# Patient Record
Sex: Female | Born: 1945 | Race: White | Hispanic: No | Marital: Married | Smoking: Former smoker
Health system: Southern US, Community
[De-identification: ages and names within clinical notes are randomized; demographics above are authoritative.]

## PROBLEM LIST (undated history)

## (undated) DIAGNOSIS — M1990 Unspecified osteoarthritis, unspecified site: Secondary | ICD-10-CM

## (undated) DIAGNOSIS — F0390 Unspecified dementia without behavioral disturbance: Secondary | ICD-10-CM

## (undated) DIAGNOSIS — F32A Depression, unspecified: Secondary | ICD-10-CM

## (undated) DIAGNOSIS — F329 Major depressive disorder, single episode, unspecified: Secondary | ICD-10-CM

## (undated) DIAGNOSIS — I10 Essential (primary) hypertension: Secondary | ICD-10-CM

## (undated) HISTORY — PX: ABDOMINAL HYSTERECTOMY: SHX81

---

## 2003-11-25 ENCOUNTER — Ambulatory Visit: Payer: Self-pay | Admitting: Internal Medicine

## 2003-12-18 ENCOUNTER — Ambulatory Visit: Payer: Self-pay | Admitting: Internal Medicine

## 2004-05-11 ENCOUNTER — Encounter: Admission: RE | Admit: 2004-05-11 | Discharge: 2004-05-11 | Payer: Self-pay

## 2004-09-10 ENCOUNTER — Encounter: Admission: RE | Admit: 2004-09-10 | Discharge: 2004-09-10 | Payer: Self-pay

## 2004-09-30 ENCOUNTER — Encounter: Admission: RE | Admit: 2004-09-30 | Discharge: 2004-09-30 | Payer: Self-pay

## 2004-10-25 ENCOUNTER — Encounter: Admission: RE | Admit: 2004-10-25 | Discharge: 2004-10-25 | Payer: Self-pay

## 2005-03-05 ENCOUNTER — Emergency Department: Payer: Self-pay | Admitting: Internal Medicine

## 2005-05-25 ENCOUNTER — Encounter: Admission: RE | Admit: 2005-05-25 | Discharge: 2005-05-25 | Payer: Self-pay

## 2005-11-23 ENCOUNTER — Ambulatory Visit: Payer: Self-pay | Admitting: Internal Medicine

## 2005-12-21 ENCOUNTER — Encounter: Admission: RE | Admit: 2005-12-21 | Discharge: 2005-12-21 | Payer: Self-pay

## 2007-01-22 ENCOUNTER — Emergency Department: Payer: Self-pay | Admitting: Unknown Physician Specialty

## 2007-01-22 ENCOUNTER — Other Ambulatory Visit: Payer: Self-pay

## 2007-01-24 ENCOUNTER — Ambulatory Visit: Payer: Self-pay | Admitting: Internal Medicine

## 2007-02-07 ENCOUNTER — Ambulatory Visit: Payer: Self-pay | Admitting: Internal Medicine

## 2007-11-28 ENCOUNTER — Emergency Department: Payer: Self-pay | Admitting: Emergency Medicine

## 2008-02-21 ENCOUNTER — Ambulatory Visit: Payer: Self-pay | Admitting: Family Medicine

## 2008-03-05 ENCOUNTER — Ambulatory Visit: Payer: Self-pay | Admitting: Family Medicine

## 2008-12-02 ENCOUNTER — Ambulatory Visit (HOSPITAL_COMMUNITY): Admission: RE | Admit: 2008-12-02 | Discharge: 2008-12-02 | Payer: Self-pay | Admitting: Unknown Physician Specialty

## 2008-12-15 ENCOUNTER — Encounter (HOSPITAL_COMMUNITY): Admission: RE | Admit: 2008-12-15 | Discharge: 2009-02-06 | Payer: Self-pay | Admitting: Unknown Physician Specialty

## 2009-03-02 ENCOUNTER — Ambulatory Visit: Payer: Self-pay | Admitting: Family Medicine

## 2009-03-09 ENCOUNTER — Encounter (HOSPITAL_COMMUNITY): Admission: RE | Admit: 2009-03-09 | Discharge: 2009-05-20 | Payer: Self-pay | Admitting: Unknown Physician Specialty

## 2010-03-03 ENCOUNTER — Ambulatory Visit: Payer: Self-pay | Admitting: Family Medicine

## 2011-05-03 ENCOUNTER — Ambulatory Visit: Payer: Self-pay | Admitting: Family Medicine

## 2011-05-12 ENCOUNTER — Ambulatory Visit: Payer: Self-pay | Admitting: Family Medicine

## 2012-07-17 ENCOUNTER — Ambulatory Visit: Payer: Self-pay | Admitting: Family Medicine

## 2013-08-19 ENCOUNTER — Ambulatory Visit: Payer: Self-pay | Admitting: Family Medicine

## 2013-09-27 ENCOUNTER — Other Ambulatory Visit (HOSPITAL_COMMUNITY): Payer: Self-pay | Admitting: Neurosurgery

## 2013-09-27 DIAGNOSIS — G912 (Idiopathic) normal pressure hydrocephalus: Secondary | ICD-10-CM

## 2013-09-30 ENCOUNTER — Other Ambulatory Visit: Payer: Self-pay | Admitting: Radiology

## 2013-09-30 ENCOUNTER — Encounter (HOSPITAL_COMMUNITY): Payer: Self-pay | Admitting: Pharmacy Technician

## 2013-10-01 ENCOUNTER — Ambulatory Visit (HOSPITAL_COMMUNITY)
Admission: RE | Admit: 2013-10-01 | Discharge: 2013-10-01 | Disposition: A | Discharge status: Home / Self Care | Payer: Medicare Other | Source: Ambulatory Visit | Attending: Neurosurgery | Admitting: Neurosurgery

## 2013-10-01 ENCOUNTER — Encounter (HOSPITAL_COMMUNITY): Payer: Self-pay | Admitting: Neurosurgery

## 2013-10-01 ENCOUNTER — Ambulatory Visit (HOSPITAL_COMMUNITY)
Admission: RE | Admit: 2013-10-01 | Discharge: 2013-10-01 | Discharge status: Against Medical Advice | Payer: Medicare Other | Source: Ambulatory Visit | Attending: Neurosurgery | Admitting: Neurosurgery

## 2013-10-01 DIAGNOSIS — G912 (Idiopathic) normal pressure hydrocephalus: Secondary | ICD-10-CM | POA: Diagnosis not present

## 2013-10-01 DIAGNOSIS — Z0389 Encounter for observation for other suspected diseases and conditions ruled out: Secondary | ICD-10-CM | POA: Diagnosis present

## 2013-10-01 DIAGNOSIS — R262 Difficulty in walking, not elsewhere classified: Secondary | ICD-10-CM | POA: Insufficient documentation

## 2013-10-01 NOTE — Progress Notes (Addendum)
Patient refuses to lie flat in bed.  Explained that her chance for spinal headache were increased if she sits up and she states "I will be fine.  I want to go home."

## 2013-10-01 NOTE — Discharge Instructions (Signed)
Lumbar Puncture A lumbar puncture, or spinal tap, is a procedure in which a small amount of the fluid that surrounds the brain and spinal cord is removed and examined. The fluid is called the cerebrospinal fluid. This procedure may be done to:   Help diagnose various problems, such as meningitis, encephalitis, multiple sclerosis, and AIDS.   Remove fluid and relieve pressure that occurs with certain types of headaches.   Look for bleeding within the brain and spinal cord areas (central nervous system).   Place medicine into the spinal fluid.  LET YOUR HEALTH CARE PROVIDER KNOW ABOUT:  Any allergies you have.  All medicines you are taking, including vitamins, herbs, eye drops, creams, and over-the-counter medicines.  Previous problems you or members of your family have had with the use of anesthetics.  Any blood disorders you have.  Previous surgeries you have had.  Medical conditions you have. RISKS AND COMPLICATIONS Generally, this is a safe procedure. However, as with any procedure, complications can occur. Possible complications include:   Spinal headache. This is a severe headache that occurs when there is a leak of spinal fluid. A spinal headache causes discomfort but is not dangerous. If it persists, another procedure may be done to treat the headache.  Bleeding. This most often occurs in people with bleeding disorders. These are disorders in which the blood does not clot normally.   Infection at the insertion site that can spread to the bone or spinal fluid.  Formation of a spinal cord tumor (rare).  Brain herniation or movement of the brain into the spinal cord (rare).  Inability to move (extremely rare). BEFORE THE PROCEDURE  You may have blood tests done. These tests can help tell how well your kidneys and liver are working. They can also show how well your blood clots.   If you take blood thinners (anticoagulant medicine), ask your health care provider if  and when you should stop taking them.   Your health care provider may order a CT scan of your brain.  Make arrangements for someone to drive you home after the procedure.  PROCEDURE  You will be positioned so that the spaces between the bones of the spine (vertebrae) are as wide as possible. This will make it easier to pass the needle into the spinal canal.  Depending on your age and size, you may lie on your side, curled up with your knees under your chin. Or, you may sit with your head resting on a pillow that is placed at waist level.  The skin covering the lower back (or lumbar region) will be cleaned.   The skin may be numbed with medicine.  You may be given pain medicine or a medicine to help you relax (sedative).  A small needle will be inserted in the skin until it enters the space that contains the spinal fluid. The needle will not enter the spinal cord.   The spinal fluid will be collected into tubes.   The needle will be withdrawn, and a bandage will be placed on the site.  AFTER THE PROCEDURE  You will remain lying down for 1 hour or for as long as your health care provider suggests.   The spinal fluid will be sent to a laboratory to be examined. The results of the examination may be available before you go home.  A test, called a culture, may be taken of the spinal fluid if your health care provider thinks you have an infection. If cultures   were taken for exam, the results will usually be available in a couple of days.  Document Released: 01/22/2000 Document Revised: 11/14/2012 Document Reviewed: 10/01/2012 [employer] Patient Information 2015 [employer]. This information is not intended to replace advice given to you by your health care provider. Make sure you discuss any questions you have with your health care provider.  

## 2013-10-01 NOTE — Progress Notes (Signed)
PT evaluated pt post procedure per MD order.  I went in to go over patient's instructions and told her her bedrest was over at 4:30 and the risks of leaving before then. The patient stated she did not want to wait til then to go home and that she was leaving to take care of her dog.  I gave her AMA papers to sign, asked her if she would like to be taken out in a wheelchair and she said she was walking out.

## 2013-10-01 NOTE — Progress Notes (Signed)
Called Dr. Llana Aliment in Radiology and notified him that patient is leaving AMA.

## 2013-12-19 NOTE — Addendum Note (Signed)
Encounter addended by: Lajuana Ripple on: 12/19/2013  9:18 AM<BR>     Documentation filed: Result Entry, Scan

## 2014-03-25 ENCOUNTER — Encounter (HOSPITAL_COMMUNITY): Payer: Self-pay | Admitting: Neurosurgery

## 2015-05-20 ENCOUNTER — Encounter: Payer: Self-pay | Admitting: Emergency Medicine

## 2015-05-20 ENCOUNTER — Emergency Department
Admission: EM | Admit: 2015-05-20 | Discharge: 2015-05-20 | Disposition: A | Discharge status: Home / Self Care | Payer: Medicare Other | Attending: Emergency Medicine | Admitting: Emergency Medicine

## 2015-05-20 ENCOUNTER — Emergency Department: Payer: Medicare Other

## 2015-05-20 DIAGNOSIS — S0012XA Contusion of left eyelid and periocular area, initial encounter: Secondary | ICD-10-CM | POA: Diagnosis present

## 2015-05-20 DIAGNOSIS — W19XXXA Unspecified fall, initial encounter: Secondary | ICD-10-CM

## 2015-05-20 DIAGNOSIS — Y929 Unspecified place or not applicable: Secondary | ICD-10-CM | POA: Diagnosis not present

## 2015-05-20 DIAGNOSIS — X58XXXA Exposure to other specified factors, initial encounter: Secondary | ICD-10-CM | POA: Diagnosis not present

## 2015-05-20 DIAGNOSIS — Y999 Unspecified external cause status: Secondary | ICD-10-CM | POA: Diagnosis not present

## 2015-05-20 DIAGNOSIS — G319 Degenerative disease of nervous system, unspecified: Secondary | ICD-10-CM | POA: Insufficient documentation

## 2015-05-20 DIAGNOSIS — Y939 Activity, unspecified: Secondary | ICD-10-CM | POA: Diagnosis not present

## 2015-05-20 DIAGNOSIS — S50312A Abrasion of left elbow, initial encounter: Secondary | ICD-10-CM | POA: Diagnosis not present

## 2015-05-20 NOTE — ED Notes (Signed)
Pt arrived from [facility] by EMS, post fall. EMS reports she was "going to the grocery store and fell when she was trying to get ready." Pt states her pain "is down on [address]." Upon assessment pt has a hematoma above the left eye and an abrasion to the left elbow.

## 2015-05-20 NOTE — Discharge Instructions (Signed)

## 2015-05-20 NOTE — ED Notes (Signed)
Bed alarm applied, non skid socks put on and bed is in the lowest position.

## 2015-05-20 NOTE — ED Notes (Signed)
Called facility, [address], Spoke with Alvis Lemmings, Facility will be in route to pick up pt.

## 2015-05-20 NOTE — ED Provider Notes (Signed)
[facility] Emergency Department Provider Note  Time seen: 5:53 AM  I have reviewed the triage vital signs and the nursing notes.   HISTORY  Chief Complaint Fall    HPI Kim Morgan is a 70 y.o. female with a past medical history of dementia, presents to the emergency department from [facility] after a fall. It was an unwitnessed fall. Patient was noted to have a hematoma above the left eye as well as an abrasion to left elbow so she was transported to the emergency department for evaluation. Patient does not recall the fall and cannot provide additional history. Unknown LOC. Patient denies any complaints currently. Does not remember the fall.     History reviewed. No pertinent past medical history.  There are no active problems to display for this patient.   History reviewed. No pertinent past surgical history.  Current Outpatient Rx  Name  Route  Sig  Dispense  Refill  . acetaminophen (TYLENOL) 325 MG tablet   Oral   Take 650 mg by mouth every 6 (six) hours as needed for headache.         Marland Kitchen buPROPion (WELLBUTRIN XL) 300 MG 24 hr tablet   Oral   Take 300 mg by mouth daily.         . calcium-vitamin D (OSCAL) 250-125 MG-UNIT per tablet   Oral   Take 1 tablet by mouth 2 (two) times daily.         . folic acid (FOLVITE) 1 MG tablet   Oral   Take 1 mg by mouth daily.         . lansoprazole (PREVACID) 15 MG capsule   Oral   Take 15 mg by mouth daily at 12 noon.         Marland Kitchen LORazepam (ATIVAN) 1 MG tablet   Oral   Take 1 mg by mouth every 8 (eight) hours.         Marland Kitchen losartan-hydrochlorothiazide (HYZAAR) 50-12.5 MG per tablet   Oral   Take 1 tablet by mouth daily.         . Multiple Vitamin (MULTIVITAMIN WITH MINERALS) TABS tablet   Oral   Take 1 tablet by mouth daily.         . nabumetone (RELAFEN) 750 MG tablet   Oral   Take 750 mg by mouth daily.         Marland Kitchen oxybutynin (DITROPAN) 5 MG tablet   Oral  Take 5 mg by mouth 3 (three) times daily.         Marland Kitchen oxyCODONE-acetaminophen (PERCOCET) 10-325 MG per tablet   Oral   Take 1 tablet by mouth 4 (four) times daily.         . predniSONE (DELTASONE) 5 MG tablet   Oral   Take 5 mg by mouth daily with breakfast.         . pregabalin (LYRICA) 75 MG capsule   Oral   Take 75 mg by mouth 2 (two) times daily.         . sertraline (ZOLOFT) 100 MG tablet   Oral   Take 100 mg by mouth daily.           Allergies Review of patient's allergies indicates no known allergies.  History reviewed. No pertinent family history.  Social History Social History  Substance Use Topics  . Smoking status: Unknown If Ever Smoked  . Smokeless tobacco: None  . Alcohol Use: No  Review of Systems Constitutional: Negative for fever. Cardiovascular: Negative for chest pain. Respiratory: Negative for shortness of breath. Gastrointestinal: Negative for abdominal pain Musculoskeletal: Abrasion to left elbow Neurological: Negative for headaches, focal weakness or numbness. 10-point ROS otherwise negative.  ____________________________________________   PHYSICAL EXAM:  VITAL SIGNS: ED Triage Vitals  Enc Vitals Group     BP 05/20/15 0546 159/76 mmHg     Pulse Rate 05/20/15 0546 75     Resp 05/20/15 0546 14     Temp 05/20/15 0546 98.6 F (37 C)     Temp Source 05/20/15 0546 Oral     SpO2 05/20/15 0546 99 %     Weight 05/20/15 0546 140 lb (63.504 kg)     Height 05/20/15 0546 5\' 4"  (1.626 m)     Head Cir --      Peak Flow --      Pain Score --      Pain Loc --      Pain Edu? --      Excl. in GC? --     Constitutional: Alert. No acute distress. Pleasant. Eyes: Normal exam ENT   Head: Small hematoma above the left eye.   Nose: Atraumatic   Mouth/Throat: Mucous membranes are moist. Cardiovascular: Normal rate, regular rhythm. No murmur Respiratory: Normal respiratory effort without tachypnea nor retractions. Breath sounds  are clear Gastrointestinal: Soft and nontender. No distention.  Musculoskeletal: Patient has an abrasion to left elbow but good range of motion in all joints. Pelvis is stable good range of motion bilateral hips without pain. Neurologic:  Normal speech and language. No gross focal neurologic deficits  Skin:  Skin is warm, dry. Abrasion to left elbow. Psychiatric: Mood and affect are normal. ____________________________________________    EKG  EKG reviewed and interpreted by myself shows normal sinus rhythm at 71 bpm, narrow QRS, normal axis, normal intervals, nonspecific ST changes  ____________________________________________    RADIOLOGY  CT scans are negative  ____________________________________________    INITIAL IMPRESSION / ASSESSMENT AND PLAN / ED COURSE  Pertinent labs & imaging results that were available during my care of the patient were reviewed by me and considered in my medical decision making (see chart for details).  Patient presents the emergency department after an unwitnessed fall. Patient does have a small hematoma above the left eye as well as an abrasion to left elbow. We will obtain a CT scan of the head and neck. Patient has good range of motion in all joints in all extremities.  CT scans are negative we will discharge the patient home at this time.  ____________________________________________   FINAL CLINICAL IMPRESSION(S) / ED DIAGNOSES  Fall Abrasion   , MD 05/20/15 [phone]

## 2015-06-13 ENCOUNTER — Emergency Department
Admission: EM | Admit: 2015-06-13 | Discharge: 2015-06-13 | Disposition: A | Discharge status: Home / Self Care | Payer: Medicare Other | Attending: Emergency Medicine | Admitting: Emergency Medicine

## 2015-06-13 DIAGNOSIS — A0811 Acute gastroenteropathy due to Norwalk agent: Secondary | ICD-10-CM | POA: Insufficient documentation

## 2015-06-13 DIAGNOSIS — M1990 Unspecified osteoarthritis, unspecified site: Secondary | ICD-10-CM | POA: Diagnosis not present

## 2015-06-13 DIAGNOSIS — R197 Diarrhea, unspecified: Secondary | ICD-10-CM

## 2015-06-13 DIAGNOSIS — R1110 Vomiting, unspecified: Secondary | ICD-10-CM

## 2015-06-13 DIAGNOSIS — F329 Major depressive disorder, single episode, unspecified: Secondary | ICD-10-CM | POA: Insufficient documentation

## 2015-06-13 DIAGNOSIS — I10 Essential (primary) hypertension: Secondary | ICD-10-CM | POA: Diagnosis not present

## 2015-06-13 DIAGNOSIS — R112 Nausea with vomiting, unspecified: Secondary | ICD-10-CM | POA: Diagnosis present

## 2015-06-13 HISTORY — DX: Unspecified osteoarthritis, unspecified site: M19.90

## 2015-06-13 HISTORY — DX: Major depressive disorder, single episode, unspecified: F32.9

## 2015-06-13 HISTORY — DX: Depression, unspecified: F32.A

## 2015-06-13 HISTORY — DX: Unspecified dementia, unspecified severity, without behavioral disturbance, psychotic disturbance, mood disturbance, and anxiety: F03.90

## 2015-06-13 HISTORY — DX: Essential (primary) hypertension: I10

## 2015-06-13 LAB — CBC WITH DIFFERENTIAL/PLATELET
Basophils Absolute: 0 10*3/uL (ref 0–0.1)
Basophils Relative: 1 %
Eosinophils Absolute: 0 10*3/uL (ref 0–0.7)
Eosinophils Relative: 1 %
HEMATOCRIT: 34.7 % — AB (ref 35.0–47.0)
HEMOGLOBIN: 11.6 g/dL — AB (ref 12.0–16.0)
LYMPHS ABS: 0.9 10*3/uL — AB (ref 1.0–3.6)
MCH: 29.7 pg (ref 26.0–34.0)
MCHC: 33.3 g/dL (ref 32.0–36.0)
MCV: 89.2 fL (ref 80.0–100.0)
Monocytes Absolute: 0.8 10*3/uL (ref 0.2–0.9)
NEUTROS ABS: 5.6 10*3/uL (ref 1.4–6.5)
Platelets: 187 10*3/uL (ref 150–440)
RBC: 3.9 MIL/uL (ref 3.80–5.20)
RDW: 15.9 % — ABNORMAL HIGH (ref 11.5–14.5)
WBC: 7.4 10*3/uL (ref 3.6–11.0)

## 2015-06-13 LAB — COMPREHENSIVE METABOLIC PANEL
ALK PHOS: 77 U/L (ref 38–126)
ALT: 39 U/L (ref 14–54)
ANION GAP: 10 (ref 5–15)
AST: 42 U/L — ABNORMAL HIGH (ref 15–41)
Albumin: 3.9 g/dL (ref 3.5–5.0)
BILIRUBIN TOTAL: 0.7 mg/dL (ref 0.3–1.2)
BUN: 14 mg/dL (ref 6–20)
CALCIUM: 8.7 mg/dL — AB (ref 8.9–10.3)
CO2: 21 mmol/L — AB (ref 22–32)
CREATININE: 0.78 mg/dL (ref 0.44–1.00)
Chloride: 108 mmol/L (ref 101–111)
Glucose, Bld: 116 mg/dL — ABNORMAL HIGH (ref 65–99)
Potassium: 3.4 mmol/L — ABNORMAL LOW (ref 3.5–5.1)
SODIUM: 139 mmol/L (ref 135–145)
TOTAL PROTEIN: 8.2 g/dL — AB (ref 6.5–8.1)

## 2015-06-13 LAB — LIPASE, BLOOD: Lipase: 26 U/L (ref 11–51)

## 2015-06-13 MED ORDER — SODIUM CHLORIDE 0.9 % IV SOLN
1000.0000 mL | Freq: Once | INTRAVENOUS | Status: AC
  Administered 2015-06-13: 1000 mL via INTRAVENOUS

## 2015-06-13 MED ORDER — ONDANSETRON HCL 4 MG/2ML IJ SOLN
4.0000 mg | Freq: Once | INTRAMUSCULAR | Status: AC
  Administered 2015-06-13: 4 mg via INTRAVENOUS
  Filled 2015-06-13: qty 2

## 2015-06-13 MED ORDER — ONDANSETRON HCL 4 MG PO TABS: 4.0000 mg | ORAL_TABLET | Freq: Every day | ORAL | Status: AC | PRN

## 2015-06-13 NOTE — ED Provider Notes (Signed)
[facility] Emergency Department Provider Note        Time seen: ----------------------------------------- 5:57 PM on 06/13/2015 -----------------------------------------  L5 caveat: Review of systems and history is limited by dementia  I have reviewed the triage vital signs and the nursing notes.   HISTORY  Chief Complaint Emesis    HPI Kim Morgan is a 70 y.o. female who presents to ER being brought in by EMS from [facility] with nausea vomiting and diarrhea that started this morning. She was given Imodium at the facility today, she has a history of dementia and cannot give further history or report.   No past medical history on file.  There are no active problems to display for this patient.   No past surgical history on file.  Allergies Review of patient's allergies indicates no known allergies.  Social History Social History  Substance Use Topics  . Smoking status: Unknown If Ever Smoked  . Smokeless tobacco: Not on file  . Alcohol Use: No    Review of Systems Unknown, positive for vomiting and diarrhea  ____________________________________________   PHYSICAL EXAM:  VITAL SIGNS: ED Triage Vitals  Enc Vitals Group     BP --      Pulse --      Resp --      Temp --      Temp src --      SpO2 --      Weight --      Height --      Head Cir --      Peak Flow --      Pain Score --      Pain Loc --      Pain Edu? --      Excl. in GC? --     Constitutional: Alert But disoriented. Well appearing and in no distress. Eyes: Conjunctivae are normal. Normal extraocular movements. ENT   Head: Normocephalic and atraumatic.   Nose: No congestion/rhinnorhea.   Mouth/Throat: Mucous membranes are moist.   Neck: No stridor. Cardiovascular: Normal rate, regular rhythm. No murmurs, rubs, or gallops. Respiratory: Normal respiratory effort without tachypnea nor retractions. Breath sounds are clear and  equal bilaterally. No wheezes/rales/rhonchi. Gastrointestinal: Soft and nontender. Normal bowel sounds Musculoskeletal: Nontender with normal range of motion in all extremities. No lower extremity tenderness nor edema. Neurologic:  Normal speech and language. No gross focal neurologic deficits are appreciated.  Skin:  Skin is warm, dry and intact. No rash noted. Psychiatric: Mood and affect are normal.   ____________________________________________  ED COURSE:  Pertinent labs & imaging results that were available during my care of the patient were reviewed by me and considered in my medical decision making (see chart for details). Patient presents in no acute distress, likely with Norovirus. She'll receive IV fluids and antiemetics. ____________________________________________    LABS (pertinent positives/negatives)  Labs Reviewed  CBC WITH DIFFERENTIAL/PLATELET - Abnormal; Notable for the following:    Hemoglobin 11.6 (*)    HCT 34.7 (*)    RDW 15.9 (*)    Lymphs Abs 0.9 (*)    All other components within normal limits  COMPREHENSIVE METABOLIC PANEL - Abnormal; Notable for the following:    Potassium 3.4 (*)    CO2 21 (*)    Glucose, Bld 116 (*)    Calcium 8.7 (*)    Total Protein 8.2 (*)    AST 42 (*)    All other components within normal limits  LIPASE,  BLOOD  URINALYSIS COMPLETEWITH MICROSCOPIC ([facility] ONLY)   ____________________________________________  FINAL ASSESSMENT AND PLAN  Norovirus infection  Plan: Patient with labs as dictated above. Patient presents to ER after vomiting and diarrhea. She has been stable here and has not had any further vomiting or diarrhea. I will discharge with antiemetics and encouraged close follow-up with her doctor.   Emily Filbert, MD   Note: This dictation was prepared with Dragon dictation. Any transcriptional errors that result from this process are unintentional   Emily Filbert, MD 06/13/15 [phone]

## 2015-06-13 NOTE — Discharge Instructions (Signed)
Norovirus Infection °A norovirus infection is caused by exposure to a virus in a group of similar viruses (noroviruses). This type of infection causes inflammation in your stomach and intestines (gastroenteritis). Norovirus is the most common cause of gastroenteritis. It also causes food poisoning. °Anyone can get a norovirus infection. It spreads very easily (contagious). You can get it from contaminated food, water, surfaces, or other people. Norovirus is found in the stool or vomit of infected people. You can spread the infection as soon as you feel sick until 2 weeks after you recover.  °Symptoms usually begin within 2 days after you become infected. Most norovirus symptoms affect the digestive system. °CAUSES °Norovirus infection is caused by contact with norovirus. You can catch norovirus if you: °· Eat or drink something contaminated with norovirus. °· Touch surfaces or objects contaminated with norovirus and then put your hand in your mouth. °· Have direct contact with an infected person who has symptoms. °· Share food, drink, or utensils with someone with who is sick with norovirus. °SIGNS AND SYMPTOMS °Symptoms of norovirus may include: °· Nausea. °· Vomiting. °· Diarrhea. °· Stomach cramps. °· Fever. °· Chills. °· Headache. °· Muscle aches. °· Tiredness. °DIAGNOSIS °Your health care provider may suspect norovirus based on your symptoms and physical exam. Your health care provider may also test a sample of your stool or vomit for the virus.  °TREATMENT °There is no specific treatment for norovirus. Most people get better without treatment in about 2 days. °HOME CARE INSTRUCTIONS °· Replace lost fluids by drinking plenty of water or rehydration fluids containing important minerals called electrolytes. This prevents dehydration. Drink enough fluid to keep your urine clear or pale yellow. °· Do not prepare food for others while you are infected. Wait at least 3 days after recovering from the illness to do  that. °PREVENTION  °· Wash your hands often, especially after using the toilet or changing a diaper. °· Wash fruits and vegetables thoroughly before preparing or serving them. °· Throw out any food that a sick person may have touched. °· Disinfect contaminated surfaces immediately after someone in the household has been sick. Use a bleach-based household cleaner. °· Immediately remove and wash soiled clothes or sheets. °SEEK MEDICAL CARE IF: °· Your vomiting, diarrhea, and stomach pain is getting worse. °· Your symptoms of norovirus do not go away after 2-3 days. °SEEK IMMEDIATE MEDICAL CARE IF:  °You develop symptoms of dehydration that do not improve with fluid replacement. This may include: °· Excessive sleepiness. °· Lack of tears. °· Dry mouth. °· Dizziness when standing. °· Weak pulse. °  °This information is not intended to replace advice given to you by your health care provider. Make sure you discuss any questions you have with your health care provider. °  °Document Released: 04/16/2002 Document Revised: 02/14/2014 Document Reviewed: 07/04/2013 °Elsevier Interactive Patient Education ©2016 [employer] ° °

## 2015-06-13 NOTE — ED Notes (Signed)
Pt presents via EMS from [facility] facility with N/V/D starting this am. Given immodium by facility today. Pt has hx dementia and is unable to give accurate history.

## 2015-06-13 NOTE — ED Notes (Signed)
Pt discharged to home.  Family member driving.  Discharge instructions reviewed.  Verbalized understanding.  No questions or concerns at this time.  Teach back verified.  Pt in NAD.  No items left in ED.   

## 2015-07-18 ENCOUNTER — Emergency Department: Payer: Medicare Other

## 2015-07-18 ENCOUNTER — Encounter: Payer: Self-pay | Admitting: Emergency Medicine

## 2015-07-18 ENCOUNTER — Inpatient Hospital Stay
Admission: EM | Admit: 2015-07-18 | Discharge: 2015-07-21 | DRG: 872 | Disposition: A | Discharge status: Home / Self Care | Payer: Medicare Other | Attending: Internal Medicine | Admitting: Internal Medicine

## 2015-07-18 DIAGNOSIS — Z8249 Family history of ischemic heart disease and other diseases of the circulatory system: Secondary | ICD-10-CM | POA: Diagnosis not present

## 2015-07-18 DIAGNOSIS — M1990 Unspecified osteoarthritis, unspecified site: Secondary | ICD-10-CM | POA: Diagnosis present

## 2015-07-18 DIAGNOSIS — G9340 Encephalopathy, unspecified: Secondary | ICD-10-CM | POA: Diagnosis not present

## 2015-07-18 DIAGNOSIS — M810 Age-related osteoporosis without current pathological fracture: Secondary | ICD-10-CM | POA: Diagnosis present

## 2015-07-18 DIAGNOSIS — A419 Sepsis, unspecified organism: Secondary | ICD-10-CM | POA: Diagnosis present

## 2015-07-18 DIAGNOSIS — R55 Syncope and collapse: Secondary | ICD-10-CM | POA: Diagnosis present

## 2015-07-18 DIAGNOSIS — Z823 Family history of stroke: Secondary | ICD-10-CM

## 2015-07-18 DIAGNOSIS — I10 Essential (primary) hypertension: Secondary | ICD-10-CM | POA: Diagnosis present

## 2015-07-18 DIAGNOSIS — G309 Alzheimer's disease, unspecified: Secondary | ICD-10-CM | POA: Diagnosis present

## 2015-07-18 DIAGNOSIS — F0280 Dementia in other diseases classified elsewhere without behavioral disturbance: Secondary | ICD-10-CM | POA: Diagnosis present

## 2015-07-18 DIAGNOSIS — R680 Hypothermia, not associated with low environmental temperature: Secondary | ICD-10-CM | POA: Diagnosis present

## 2015-07-18 DIAGNOSIS — Z87891 Personal history of nicotine dependence: Secondary | ICD-10-CM

## 2015-07-18 DIAGNOSIS — R569 Unspecified convulsions: Secondary | ICD-10-CM | POA: Diagnosis present

## 2015-07-18 DIAGNOSIS — N390 Urinary tract infection, site not specified: Secondary | ICD-10-CM | POA: Diagnosis present

## 2015-07-18 DIAGNOSIS — Z79899 Other long term (current) drug therapy: Secondary | ICD-10-CM | POA: Diagnosis not present

## 2015-07-18 DIAGNOSIS — M797 Fibromyalgia: Secondary | ICD-10-CM | POA: Diagnosis present

## 2015-07-18 DIAGNOSIS — E872 Acidosis: Secondary | ICD-10-CM | POA: Diagnosis present

## 2015-07-18 DIAGNOSIS — M069 Rheumatoid arthritis, unspecified: Secondary | ICD-10-CM | POA: Diagnosis present

## 2015-07-18 DIAGNOSIS — T68XXXA Hypothermia, initial encounter: Secondary | ICD-10-CM | POA: Diagnosis present

## 2015-07-18 DIAGNOSIS — Z90710 Acquired absence of both cervix and uterus: Secondary | ICD-10-CM

## 2015-07-18 LAB — URINALYSIS COMPLETE WITH MICROSCOPIC (ARMC ONLY)
Bilirubin Urine: NEGATIVE
Glucose, UA: NEGATIVE mg/dL
HGB URINE DIPSTICK: NEGATIVE
KETONES UR: NEGATIVE mg/dL
Nitrite: NEGATIVE
PROTEIN: 30 mg/dL — AB
SPECIFIC GRAVITY, URINE: 1.013 (ref 1.005–1.030)
pH: 5 (ref 5.0–8.0)

## 2015-07-18 LAB — COMPREHENSIVE METABOLIC PANEL
ALBUMIN: 3.8 g/dL (ref 3.5–5.0)
ALBUMIN: 3.6 g/dL (ref 3.5–5.0)
ALT: 42 U/L (ref 14–54)
ALT: 44 U/L (ref 14–54)
AST: 58 U/L — AB (ref 15–41)
AST: 53 U/L — ABNORMAL HIGH (ref 15–41)
Alkaline Phosphatase: 72 U/L (ref 38–126)
Alkaline Phosphatase: 69 U/L (ref 38–126)
Anion gap: 15 (ref 5–15)
Anion gap: 6 (ref 5–15)
BUN: 13 mg/dL (ref 6–20)
BUN: 11 mg/dL (ref 6–20)
CHLORIDE: 106 mmol/L (ref 101–111)
CHLORIDE: 110 mmol/L (ref 101–111)
CO2: 18 mmol/L — AB (ref 22–32)
CO2: 23 mmol/L (ref 22–32)
CREATININE: 0.98 mg/dL (ref 0.44–1.00)
CREATININE: 0.77 mg/dL (ref 0.44–1.00)
Calcium: 8.9 mg/dL (ref 8.9–10.3)
Calcium: 8.3 mg/dL — ABNORMAL LOW (ref 8.9–10.3)
GFR calc Af Amer: >60 mL/min (ref >60)
GFR calc non Af Amer: 58 mL/min — ABNORMAL LOW (ref >60)
GFR calc non Af Amer: >60 mL/min (ref >60)
GLUCOSE: 147 mg/dL — AB (ref 65–99)
GLUCOSE: 109 mg/dL — AB (ref 65–99)
POTASSIUM: 3.6 mmol/L (ref 3.5–5.1)
Potassium: 3.6 mmol/L (ref 3.5–5.1)
SODIUM: 139 mmol/L (ref 135–145)
SODIUM: 139 mmol/L (ref 135–145)
Total Bilirubin: 0.6 mg/dL (ref 0.3–1.2)
Total Bilirubin: 0.6 mg/dL (ref 0.3–1.2)
Total Protein: 8.2 g/dL — ABNORMAL HIGH (ref 6.5–8.1)
Total Protein: 7.8 g/dL (ref 6.5–8.1)

## 2015-07-18 LAB — CBC WITH DIFFERENTIAL/PLATELET
BASOS ABS: 0 10*3/uL (ref 0–0.1)
BASOS ABS: 0 10*3/uL (ref 0–0.1)
BASOS PCT: 1 %
EOS ABS: 0.1 10*3/uL (ref 0–0.7)
EOS PCT: 2 %
Eosinophils Absolute: 0.1 10*3/uL (ref 0–0.7)
HCT: 39.3 % (ref 35.0–47.0)
HCT: 37.7 % (ref 35.0–47.0)
Hemoglobin: 13.1 g/dL (ref 12.0–16.0)
Hemoglobin: 12.5 g/dL (ref 12.0–16.0)
LYMPHS ABS: 1 10*3/uL (ref 1.0–3.6)
LYMPHS PCT: 29 %
Lymphocytes Relative: 10 %
Lymphs Abs: 1.7 10*3/uL (ref 1.0–3.6)
MCH: 29.8 pg (ref 26.0–34.0)
MCH: 28.8 pg (ref 26.0–34.0)
MCHC: 33.3 g/dL (ref 32.0–36.0)
MCHC: 33.2 g/dL (ref 32.0–36.0)
MCV: 89.5 fL (ref 80.0–100.0)
MCV: 86.7 fL (ref 80.0–100.0)
MONO ABS: 0.5 10*3/uL (ref 0.2–0.9)
Monocytes Absolute: 0.4 10*3/uL (ref 0.2–0.9)
Monocytes Relative: 9 %
Monocytes Relative: 5 %
NEUTROS ABS: 3.4 10*3/uL (ref 1.4–6.5)
NEUTROS PCT: 59 %
Neutro Abs: 8.2 10*3/uL — ABNORMAL HIGH (ref 1.4–6.5)
PLATELETS: 188 10*3/uL (ref 150–440)
PLATELETS: 191 10*3/uL (ref 150–440)
RBC: 4.39 MIL/uL (ref 3.80–5.20)
RBC: 4.35 MIL/uL (ref 3.80–5.20)
RDW: 16.1 % — AB (ref 11.5–14.5)
RDW: 16.1 % — ABNORMAL HIGH (ref 11.5–14.5)
WBC: 5.7 10*3/uL (ref 3.6–11.0)
WBC: 9.7 10*3/uL (ref 3.6–11.0)

## 2015-07-18 LAB — APTT: APTT: 31 s (ref 24–36)

## 2015-07-18 LAB — LACTIC ACID, PLASMA
Lactic Acid, Venous: 5.7 mmol/L (ref 0.5–2.0)
Lactic Acid, Venous: 1.5 mmol/L (ref 0.5–2.0)
Lactic Acid, Venous: 1.4 mmol/L (ref 0.5–2.0)

## 2015-07-18 LAB — TROPONIN I

## 2015-07-18 LAB — PROTIME-INR
INR: 1.13
PROTHROMBIN TIME: 14.7 s (ref 11.4–15.0)

## 2015-07-18 LAB — MRSA PCR SCREENING: MRSA by PCR: NEGATIVE

## 2015-07-18 LAB — PROCALCITONIN

## 2015-07-18 MED ORDER — ENOXAPARIN SODIUM 40 MG/0.4ML ~~LOC~~ SOLN
40.0000 mg | SUBCUTANEOUS | Status: DC
  Administered 2015-07-18 – 2015-07-21 (×4): 40 mg via SUBCUTANEOUS
  Filled 2015-07-18 (×4): qty 0.4

## 2015-07-18 MED ORDER — VANCOMYCIN HCL IN DEXTROSE 1-5 GM/200ML-% IV SOLN
1000.0000 mg | Freq: Once | INTRAVENOUS | Status: AC
  Administered 2015-07-18: 1000 mg via INTRAVENOUS
  Filled 2015-07-18: qty 200

## 2015-07-18 MED ORDER — PIPERACILLIN-TAZOBACTAM 3.375 G IVPB 30 MIN: 3.3750 g | Freq: Once | INTRAVENOUS | Status: DC

## 2015-07-18 MED ORDER — ONDANSETRON HCL 4 MG PO TABS: 4.0000 mg | ORAL_TABLET | Freq: Four times a day (QID) | ORAL | Status: DC | PRN

## 2015-07-18 MED ORDER — PIPERACILLIN-TAZOBACTAM 3.375 G IVPB 30 MIN
3.3750 g | Freq: Once | INTRAVENOUS | Status: AC
  Administered 2015-07-18: 3.375 g via INTRAVENOUS
  Filled 2015-07-18: qty 50

## 2015-07-18 MED ORDER — SODIUM CHLORIDE 0.9 % IV BOLUS (SEPSIS)
1000.0000 mL | Freq: Once | INTRAVENOUS | Status: AC
  Administered 2015-07-18: 1000 mL via INTRAVENOUS

## 2015-07-18 MED ORDER — SODIUM CHLORIDE 0.9% FLUSH
3.0000 mL | Freq: Two times a day (BID) | INTRAVENOUS | Status: DC
  Administered 2015-07-18 – 2015-07-21 (×7): 3 mL via INTRAVENOUS

## 2015-07-18 MED ORDER — PANTOPRAZOLE SODIUM 40 MG PO TBEC
40.0000 mg | DELAYED_RELEASE_TABLET | Freq: Every day | ORAL | Status: DC
  Administered 2015-07-18 – 2015-07-21 (×4): 40 mg via ORAL
  Filled 2015-07-18 (×4): qty 1

## 2015-07-18 MED ORDER — PREDNISONE 5 MG PO TABS
5.0000 mg | ORAL_TABLET | Freq: Every day | ORAL | Status: DC
  Administered 2015-07-18 – 2015-07-21 (×4): 5 mg via ORAL
  Filled 2015-07-18: qty 2
  Filled 2015-07-18 (×3): qty 1

## 2015-07-18 MED ORDER — CALCIUM CARBONATE-VITAMIN D 500-200 MG-UNIT PO TABS
1.0000 | ORAL_TABLET | Freq: Two times a day (BID) | ORAL | Status: DC
  Administered 2015-07-18 – 2015-07-21 (×7): 1 via ORAL
  Filled 2015-07-18 (×8): qty 1

## 2015-07-18 MED ORDER — ADULT MULTIVITAMIN W/MINERALS CH
1.0000 | ORAL_TABLET | Freq: Every day | ORAL | Status: DC
  Administered 2015-07-18 – 2015-07-21 (×4): 1 via ORAL
  Filled 2015-07-18 (×4): qty 1

## 2015-07-18 MED ORDER — ACETAMINOPHEN 325 MG PO TABS: 650.0000 mg | ORAL_TABLET | Freq: Four times a day (QID) | ORAL | Status: DC | PRN

## 2015-07-18 MED ORDER — PREGABALIN 75 MG PO CAPS
75.0000 mg | ORAL_CAPSULE | Freq: Two times a day (BID) | ORAL | Status: DC
  Administered 2015-07-18 – 2015-07-21 (×7): 75 mg via ORAL
  Filled 2015-07-18 (×7): qty 1

## 2015-07-18 MED ORDER — VANCOMYCIN HCL IN DEXTROSE 1-5 GM/200ML-% IV SOLN
1000.0000 mg | INTRAVENOUS | Status: DC
  Administered 2015-07-18 – 2015-07-19 (×2): 1000 mg via INTRAVENOUS
  Filled 2015-07-18 (×2): qty 200

## 2015-07-18 MED ORDER — SENNOSIDES-DOCUSATE SODIUM 8.6-50 MG PO TABS: 1.0000 | ORAL_TABLET | Freq: Every evening | ORAL | Status: DC | PRN

## 2015-07-18 MED ORDER — BUPROPION HCL ER (XL) 150 MG PO TB24
300.0000 mg | ORAL_TABLET | Freq: Every day | ORAL | Status: DC
  Administered 2015-07-18 – 2015-07-20 (×3): 300 mg via ORAL
  Filled 2015-07-18 (×3): qty 2

## 2015-07-18 MED ORDER — SODIUM CHLORIDE 0.9 % IV SOLN
INTRAVENOUS | Status: DC
  Administered 2015-07-18 – 2015-07-20 (×4): via INTRAVENOUS

## 2015-07-18 MED ORDER — SODIUM CHLORIDE 0.9 % IV BOLUS (SEPSIS)
500.0000 mL | Freq: Once | INTRAVENOUS | Status: AC
  Administered 2015-07-18: 500 mL via INTRAVENOUS

## 2015-07-18 MED ORDER — ACETAMINOPHEN 650 MG RE SUPP: 650.0000 mg | Freq: Four times a day (QID) | RECTAL | Status: DC | PRN

## 2015-07-18 MED ORDER — ONDANSETRON HCL 4 MG/2ML IJ SOLN: 4.0000 mg | Freq: Four times a day (QID) | INTRAMUSCULAR | Status: DC | PRN

## 2015-07-18 MED ORDER — PIPERACILLIN-TAZOBACTAM 3.375 G IVPB
3.3750 g | Freq: Three times a day (TID) | INTRAVENOUS | Status: DC
  Administered 2015-07-18 – 2015-07-19 (×4): 3.375 g via INTRAVENOUS
  Filled 2015-07-18 (×6): qty 50

## 2015-07-18 MED ORDER — FOLIC ACID 1 MG PO TABS
1.0000 mg | ORAL_TABLET | Freq: Every day | ORAL | Status: DC
  Administered 2015-07-18 – 2015-07-21 (×4): 1 mg via ORAL
  Filled 2015-07-18 (×4): qty 1

## 2015-07-18 MED ORDER — SERTRALINE HCL 100 MG PO TABS
100.0000 mg | ORAL_TABLET | Freq: Every day | ORAL | Status: DC
  Administered 2015-07-18 – 2015-07-21 (×4): 100 mg via ORAL
  Filled 2015-07-18 (×4): qty 1

## 2015-07-18 MED ORDER — VANCOMYCIN HCL IN DEXTROSE 1-5 GM/200ML-% IV SOLN: 1000.0000 mg | Freq: Once | INTRAVENOUS | Status: DC

## 2015-07-18 MED ORDER — OXYBUTYNIN CHLORIDE 5 MG PO TABS
5.0000 mg | ORAL_TABLET | Freq: Three times a day (TID) | ORAL | Status: DC
  Administered 2015-07-18 – 2015-07-21 (×10): 5 mg via ORAL
  Filled 2015-07-18 (×12): qty 1

## 2015-07-18 MED ORDER — LORAZEPAM 1 MG PO TABS
1.0000 mg | ORAL_TABLET | Freq: Three times a day (TID) | ORAL | Status: DC
  Administered 2015-07-18 – 2015-07-20 (×7): 1 mg via ORAL
  Filled 2015-07-18 (×7): qty 1

## 2015-07-18 NOTE — ED Notes (Signed)
Patient transported to CT 

## 2015-07-18 NOTE — ED Notes (Signed)
Critical LAB: LACTIC 5.7, Dr Derrill Kay notified

## 2015-07-18 NOTE — Progress Notes (Signed)
Patient transferring to room 101, 1C. Report called to [facility]. Husband at bedside updated with new rm number, reports he will tell [facility] staff who were to come visit patient later this evening. Belongings packed and will be transported with patient to new room.

## 2015-07-18 NOTE — Progress Notes (Signed)
Pharmacy Antibiotic Note  Kim Morgan is a 70 y.o. female admitted on 07/18/2015 with sepsis.  Pharmacy has been consulted for Zosyn and vancomycin dosing.  Plan: 1. Zosyn 3.375 gm IV Q8H EI 2. Vancomycin 1 gm IV Q18H, predicted trough 15 mcg/mL. Pharmacy will continue to follow and adjust as needed to maintain trough 15 to 20 mcg/mL.   Vd 45.2 L, Ke 0.052 hr-1, T1/2 13.4 hr  Height: 5\' 5"  (165.1 cm) Weight: 169 lb (76.658 kg) IBW/kg (Calculated) : 57  Temp (24hrs), Avg:96.9 F (36.1 C), Min:96.1 F (35.6 C), Max:97.6 F (36.4 C)   Recent Labs Lab 07/18/15 0600  WBC 5.7  CREATININE 0.98  LATICACIDVEN 5.7*    Estimated Creatinine Clearance: 57.1 mL/min (by C-G formula based on Cr of 0.98).    No Known Allergies  Thank you for allowing pharmacy to be a part of this patient's care.  09/17/15, Pharm.D., BCPS Clinical Pharmacist 07/18/2015 7:11 AM

## 2015-07-18 NOTE — Progress Notes (Signed)
VSS, pt assessment unchanged from admission. Transported to 1C 101.

## 2015-07-18 NOTE — Progress Notes (Addendum)
Pt. admitted to unit, [id number] from ED, report from [facility]. Oriented to room, call bell, Ascom phones and staff. Bed in low position. Admission profile completed with husband at bedside. Fall safety plan reviewed with husband, yellow non-skid socks in place, bed alarm on. Full assessment to Epic; skin assessed with Larose Hires, RN, dry flaky skin noted to BLEs, MSAD to perineal area, antifungal powder to said area. Telemetry box verified with CCMD and Virgina Jock NT: [id number] . Will continue to monitor.

## 2015-07-18 NOTE — ED Provider Notes (Signed)
[facility] Emergency Department Provider Note    ____________________________________________  Time seen: ~0600  I have reviewed the triage vital signs and the nursing notes.   HISTORY  Chief Complaint Seizures   History limited by: Dementia   HPI Kim Morgan is a 70 y.o. female with history of Alzheimer's dementia who presents to the emergency department today from living facility via EMS. The patient is unable to give any history. Per EMS the patient was on the toilet when she started to have a seizure. Nursing staff was there said they were able to help her off of the toilet. Nursing staff denied any history of seizures.    Past Medical History  Diagnosis Date  . Hypertension   . Dementia   . Osteoarthritis   . Depression     There are no active problems to display for this patient.   History reviewed. No pertinent past surgical history.  Current Outpatient Rx  Name  Route  Sig  Dispense  Refill  . acetaminophen (TYLENOL) 325 MG tablet   Oral   Take 650 mg by mouth every 6 (six) hours as needed for headache.         Marland Kitchen buPROPion (WELLBUTRIN XL) 300 MG 24 hr tablet   Oral   Take 300 mg by mouth daily.         . calcium-vitamin D (OSCAL) 250-125 MG-UNIT per tablet   Oral   Take 1 tablet by mouth 2 (two) times daily.         . folic acid (FOLVITE) 1 MG tablet   Oral   Take 1 mg by mouth daily.         . lansoprazole (PREVACID) 15 MG capsule   Oral   Take 15 mg by mouth daily at 12 noon.         Marland Kitchen LORazepam (ATIVAN) 1 MG tablet   Oral   Take 1 mg by mouth every 8 (eight) hours.         Marland Kitchen losartan-hydrochlorothiazide (HYZAAR) 50-12.5 MG per tablet   Oral   Take 1 tablet by mouth daily.         . Multiple Vitamin (MULTIVITAMIN WITH MINERALS) TABS tablet   Oral   Take 1 tablet by mouth daily.         . nabumetone (RELAFEN) 750 MG tablet   Oral   Take 750 mg by mouth daily.         . ondansetron (ZOFRAN)  4 MG tablet   Oral   Take 1 tablet (4 mg total) by mouth daily as needed for nausea or vomiting.   20 tablet   1   . oxybutynin (DITROPAN) 5 MG tablet   Oral   Take 5 mg by mouth 3 (three) times daily.         Marland Kitchen oxyCODONE-acetaminophen (PERCOCET) 10-325 MG per tablet   Oral   Take 1 tablet by mouth 4 (four) times daily.         . predniSONE (DELTASONE) 5 MG tablet   Oral   Take 5 mg by mouth daily with breakfast.         . pregabalin (LYRICA) 75 MG capsule   Oral   Take 75 mg by mouth 2 (two) times daily.         . sertraline (ZOLOFT) 100 MG tablet   Oral   Take 100 mg by mouth daily.  Allergies Review of patient's allergies indicates no known allergies.  History reviewed. No pertinent family history.  Social History Social History  Substance Use Topics  . Smoking status: Unknown If Ever Smoked  . Smokeless tobacco: None  . Alcohol Use: No    Review of Systems Unable to obtain secondary to dementia/altered mental status  ____________________________________________   PHYSICAL EXAM:  VITAL SIGNS: ED Triage Vitals  Enc Vitals Group     BP 07/18/15 0600 139/81 mmHg     Pulse Rate 07/18/15 0600 97     Resp 07/18/15 0600 17     Temp 07/18/15 0600 96.1 F (35.6 C)     Temp Source 07/18/15 0600 Rectal     SpO2 07/18/15 0600 97 %     Weight 07/18/15 0600 180 lb (81.647 kg)     Height 07/18/15 0600 5\' 5"  (1.651 m)   Constitutional: Awake and alert. Not oriented. Unable to answer my questions. Eyes: Conjunctivae are normal. PERRL. Normal extraocular movements. ENT   Head: Normocephalic and atraumatic.   Nose: No congestion/rhinnorhea.   Mouth/Throat: Mucous membranes are moist.   Neck: No stridor. Hematological/Lymphatic/Immunilogical: No cervical lymphadenopathy. Cardiovascular: Tachycardic, regular rhythm.  No murmurs, rubs, or gallops. Respiratory: Normal respiratory effort without tachypnea nor retractions. Breath sounds  are clear and equal bilaterally. No wheezes/rales/rhonchi. Gastrointestinal: Soft and nontender. No distention.  Genitourinary: Deferred Musculoskeletal: Normal range of motion in all extremities. No joint effusions.  No lower extremity tenderness nor edema. Neurologic:  Normal speech and language. No gross focal neurologic deficits are appreciated.  Skin:  Skin is warm, dry and intact. No rash noted. Psychiatric: Mood and affect are normal. Speech and behavior are normal. Patient exhibits appropriate insight and judgment.  ____________________________________________    LABS (pertinent positives/negatives)  Labs Reviewed  CBC WITH DIFFERENTIAL/PLATELET - Abnormal; Notable for the following:    RDW 16.1 (*)    All other components within normal limits  COMPREHENSIVE METABOLIC PANEL - Abnormal; Notable for the following:    CO2 18 (*)    Glucose, Bld 147 (*)    Total Protein 8.2 (*)    AST 58 (*)    GFR calc non Af Amer 58 (*)    All other components within normal limits  URINALYSIS COMPLETEWITH MICROSCOPIC ([facility] ONLY) - Abnormal; Notable for the following:    Color, Urine YELLOW (*)    APPearance CLOUDY (*)    Protein, ur 30 (*)    Leukocytes, UA 2+ (*)    Bacteria, UA FEW (*)    Squamous Epithelial / LPF 0-5 (*)    All other components within normal limits  CULTURE, BLOOD (ROUTINE X 2)  CULTURE, BLOOD (ROUTINE X 2)  URINE CULTURE  TROPONIN I  LACTIC ACID, PLASMA  LACTIC ACID, PLASMA   Lactic 5.7  ____________________________________________   EKG  I, , attending physician, personally viewed and interpreted this EKG  EKG Time: 0600 Rate: 104 Rhythm: sinus tachycardia Axis: normal Intervals: qtc 495 QRS: narrow, q waves V1, V2, III ST changes: no st elevation Impression: abnormal ekg  ____________________________________________    RADIOLOGY  CT head IMPRESSION: 1. No acute intracranial pathology seen on CT. 2. Moderate cortical volume  loss and scattered small vessel ischemic microangiopathy.   ____________________________________________   PROCEDURES  Procedure(s) performed: None  Critical Care performed: Yes, see critical care note(s)  CRITICAL CARE Performed by: Phineas Semen   Total critical care time: 30 minutes  Critical care time was exclusive of separately billable  procedures and treating other patients.  Critical care was necessary to treat or prevent imminent or life-threatening deterioration.  Critical care was time spent personally by me on the following activities: development of treatment plan with patient and/or surrogate as well as nursing, discussions with consultants, evaluation of patient's response to treatment, examination of patient, obtaining history from patient or surrogate, ordering and performing treatments and interventions, ordering and review of laboratory studies, ordering and review of radiographic studies, pulse oximetry and re-evaluation of patient's condition.  ____________________________________________   INITIAL IMPRESSION / ASSESSMENT AND PLAN / ED COURSE  Pertinent labs & imaging results that were available during my care of the patient were reviewed by me and considered in my medical decision making (see chart for details).  Patient presented to the emergency department today after seizure-like episode. On exam patient was hypothermic and tachycardic. Additionally there is some concern for urinary tract infection. Because of this a code sepsis was called. Furthermore I will obtain a head CT.  ----------------------------------------- 7:05 AM on 07/18/2015 -----------------------------------------  Urine is consistent with a urinary tract infection. Lactic acid was elevated. At this point I think more likely the patient's episode was peri-syncopal episode. Head CT negative. Patient receiving broad spectrum antibiotics and fluids.  Will plan on admitting to the  hospitalist service.   ____________________________________________   FINAL CLINICAL IMPRESSION(S) / ED DIAGNOSES  Final diagnoses:  UTI (lower urinary tract infection)  Sepsis, due to unspecified organism [facility])     Note: This dictation was prepared with Dragon dictation. Any transcriptional errors that result from this process are unintentional    Phineas Semen, MD 07/18/15 [phone]

## 2015-07-18 NOTE — ED Notes (Signed)
Patient presents to Emergency Department via EMS with complaints of witnessed seizure per staff at [facility].   No hx of seizures, per EMS pt is speaking through "twisted mouth".

## 2015-07-18 NOTE — Progress Notes (Addendum)
Telemetry d/c'd per physician order. Box 29 returned to Diplomatic Services operational officer. Per MD Dr. Imogene Burn, okay for pt to transfer to any med/surg floor.

## 2015-07-18 NOTE — H&P (Signed)
[facility] -  at [facility]   PATIENT NAME: Kim Morgan    MR#:  [id number]  DATE OF BIRTH:  August 15, 1945  DATE OF ADMISSION:  07/18/2015  PRIMARY CARE PHYSICIAN: Pcp Not In System   REQUESTING/REFERRING PHYSICIAN:   CHIEF COMPLAINT:   Chief Complaint  Patient presents with  . Seizures    HISTORY OF PRESENT ILLNESS: Kim Morgan  is a 70 y.o. female with a known history of hypertension, as well as dementia, osteoarthritis, osteoporosis, rheumatoid arthritis, fibromyalgia presented to the emergency room after she was sent from assisted living facility for a seizure-like activity. Patient was sitting on the toilet and she had seizure-like activity. She was helped by the nursing staff to get off the toilet. No history of any head injury. Patient was brought to the emergency room for evaluation of body temperature was low around 95.33F which later on improved to 97.3F. Patient was started on broad-spectrum IV antibiotics for sepsis and lactate level was high. Patient has dementia and unable to give any history. Most of the history obtained from ER physician and transfer note. Patient was worked up with CT head which showed no acute intracranial abnormality. No new episodes of any seizure-like activity the emergency room. No history of any loss of consciousness as per the nursing home staff.  PAST MEDICAL HISTORY:   Past Medical History  Diagnosis Date  . Hypertension   . Dementia   . Osteoarthritis   . Depression     PAST SURGICAL HISTORY: History reviewed. No pertinent past surgical history.  SOCIAL HISTORY:  Social History  Substance Use Topics  . Smoking status: Unknown If Ever Smoked  . Smokeless tobacco: Not on file  . Alcohol Use: No    FAMILY HISTORY: History reviewed. No pertinent family history.  DRUG ALLERGIES: No Known Allergies  REVIEW OF SYSTEMS:  Unable to give secondary to dementia  MEDICATIONS AT HOME:  Prior to Admission  medications   Medication Sig Start Date End Date Taking? Authorizing Provider  acetaminophen (TYLENOL) 325 MG tablet Take 650 mg by mouth every 6 (six) hours as needed for headache.    Historical Provider, MD  buPROPion (WELLBUTRIN XL) 300 MG 24 hr tablet Take 300 mg by mouth daily.    Historical Provider, MD  calcium-vitamin D (OSCAL) 250-125 MG-UNIT per tablet Take 1 tablet by mouth 2 (two) times daily.    Historical Provider, MD  folic acid (FOLVITE) 1 MG tablet Take 1 mg by mouth daily.    Historical Provider, MD  lansoprazole (PREVACID) 15 MG capsule Take 15 mg by mouth daily at 12 noon.    Historical Provider, MD  LORazepam (ATIVAN) 1 MG tablet Take 1 mg by mouth every 8 (eight) hours.    Historical Provider, MD  losartan-hydrochlorothiazide (HYZAAR) 50-12.5 MG per tablet Take 1 tablet by mouth daily.    Historical Provider, MD  Multiple Vitamin (MULTIVITAMIN WITH MINERALS) TABS tablet Take 1 tablet by mouth daily.    Historical Provider, MD  nabumetone (RELAFEN) 750 MG tablet Take 750 mg by mouth daily.    Historical Provider, MD  ondansetron (ZOFRAN) 4 MG tablet Take 1 tablet (4 mg total) by mouth daily as needed for nausea or vomiting. 06/13/15   Emily Filbert, MD  oxybutynin (DITROPAN) 5 MG tablet Take 5 mg by mouth 3 (three) times daily.    Historical Provider, MD  oxyCODONE-acetaminophen (PERCOCET) 10-325 MG per tablet Take 1 tablet by mouth 4 (four) times daily.  Historical Provider, MD  predniSONE (DELTASONE) 5 MG tablet Take 5 mg by mouth daily with breakfast.    Historical Provider, MD  pregabalin (LYRICA) 75 MG capsule Take 75 mg by mouth 2 (two) times daily.    Historical Provider, MD  sertraline (ZOLOFT) 100 MG tablet Take 100 mg by mouth daily.    Historical Provider, MD      PHYSICAL EXAMINATION:   VITAL SIGNS: Blood pressure 148/75, pulse 87, temperature 97.6 F (36.4 C), temperature source Rectal, resp. rate 17, height 5\' 5"  (1.651 m), weight 76.658 kg (169 lb),  SpO2 99 %.  GENERAL:  71 y.o.-year-old patient lying in the bed with no acute distress.  EYES: Pupils equal, round, reactive to light and accommodation. No scleral icterus. Extraocular muscles intact.  HEENT: Head atraumatic, normocephalic. Oropharynx dry and nasopharynx clear.  NECK:  Supple, no jugular venous distention. No thyroid enlargement, no tenderness.  LUNGS: Normal breath sounds bilaterally, no wheezing, rales,rhonchi or crepitation. No use of accessory muscles of respiration.  CARDIOVASCULAR: S1, S2 normal. No murmurs, rubs, or gallops.  ABDOMEN: Soft, nontender, nondistended. Bowel sounds present. No organomegaly or mass.  EXTREMITIES: No pedal edema, cyanosis, or clubbing.  NEUROLOGIC: Awake and alert, moves all extremities. PSYCHIATRIC: could not be assessed secondary to dementia SKIN: No obvious rash, lesion, or ulcer.   LABORATORY PANEL:   CBC  Recent Labs Lab 07/18/15 0600  WBC 5.7  HGB 13.1  HCT 39.3  PLT 188  MCV 89.5  MCH 29.8  MCHC 33.3  RDW 16.1*  LYMPHSABS 1.7  MONOABS 0.5  EOSABS 0.1  BASOSABS 0.0   ------------------------------------------------------------------------------------------------------------------  Chemistries   Recent Labs Lab 07/18/15 0600  NA 139  K 3.6  CL 106  CO2 18*  GLUCOSE 147*  BUN 13  CREATININE 0.98  CALCIUM 8.9  AST 58*  ALT 42  ALKPHOS 72  BILITOT 0.6   ------------------------------------------------------------------------------------------------------------------ estimated creatinine clearance is 57.1 mL/min (by C-G formula based on Cr of 0.98). ------------------------------------------------------------------------------------------------------------------ No results for input(s): TSH, T4TOTAL, T3FREE, THYROIDAB in the last 72 hours.  Invalid input(s): FREET3   Coagulation profile No results for input(s): INR, PROTIME in the last 168  hours. ------------------------------------------------------------------------------------------------------------------- No results for input(s): DDIMER in the last 72 hours. -------------------------------------------------------------------------------------------------------------------  Cardiac Enzymes  Recent Labs Lab 07/18/15 0600  TROPONINI <0.03   ------------------------------------------------------------------------------------------------------------------ Invalid input(s): POCBNP  ---------------------------------------------------------------------------------------------------------------  Urinalysis    Component Value Date/Time   COLORURINE YELLOW* 07/18/2015 0602   APPEARANCEUR CLOUDY* 07/18/2015 0602   LABSPEC 1.013 07/18/2015 0602   PHURINE 5.0 07/18/2015 0602   GLUCOSEU NEGATIVE 07/18/2015 0602   HGBUR NEGATIVE 07/18/2015 0602   BILIRUBINUR NEGATIVE 07/18/2015 0602   KETONESUR NEGATIVE 07/18/2015 0602   PROTEINUR 30* 07/18/2015 0602   NITRITE NEGATIVE 07/18/2015 0602   LEUKOCYTESUR 2+* 07/18/2015 0602     RADIOLOGY: Ct Head Wo Contrast  07/18/2015  CLINICAL DATA:  Acute onset of witnessed seizure. Initial encounter. EXAM: CT HEAD WITHOUT CONTRAST TECHNIQUE: Contiguous axial images were obtained from the base of the skull through the vertex without intravenous contrast. COMPARISON:  CT of the head performed 05/20/2015 FINDINGS: There is no evidence of acute infarction, mass lesion, or intra- or extra-axial hemorrhage on CT. Prominence of ventricles and sulci reflects moderate cortical volume loss. Mild cerebellar atrophy is noted. Scattered periventricular and subcortical white matter change likely reflects small vessel ischemic microangiopathy. The brainstem and fourth ventricle are within normal limits. The basal ganglia are unremarkable in appearance. The cerebral hemispheres demonstrate grossly  normal gray-white differentiation. No mass effect or midline  shift is seen. There is no evidence of fracture; visualized osseous structures are unremarkable in appearance. The orbits are within normal limits. The paranasal sinuses and mastoid air cells are well-aerated. No significant soft tissue abnormalities are seen. IMPRESSION: 1. No acute intracranial pathology seen on CT. 2. Moderate cortical volume loss and scattered small vessel ischemic microangiopathy. Electronically Signed   By: Roanna Raider M.D.   On: 07/18/2015 06:49   Dg Chest Port 1 View  07/18/2015  CLINICAL DATA:  Status post witnessed seizure. Concern for chest injury. Initial encounter. EXAM: PORTABLE CHEST 1 VIEW COMPARISON:  Chest radiograph performed 11/28/2007 FINDINGS: The lungs are hypoexpanded. Mild peribronchial thickening is noted. Mild left basilar atelectasis is seen. No pleural effusion or pneumothorax is identified. The cardiomediastinal silhouette is normal in size. No acute osseous abnormalities are identified. IMPRESSION: Lungs hypoexpanded, with mild peribronchial thickening and mild left basilar atelectasis. Electronically Signed   By: Roanna Raider M.D.   On: 07/18/2015 06:59    EKG: Orders placed or performed during the hospital encounter of 07/18/15  . EKG 12-Lead  . EKG 12-Lead    IMPRESSION AND PLAN: 70 year old female patient with history of them was dementia, hypertension, osteoporosis, fibromyalgia presented to the emergency room because of seizure-like activity and presyncopal event. Admitting diagnosis 1. Sepsis 2. Hypothermia secondary to sepsis 3. Near syncope 4. Seizure-like activity 5. Dementia Treatment plan Admit patient to telemetry Start patient on IV vancomycin and IV Zosyn antibiotic Monitor for any new seizure-like activity Warming blanket for hypothermia temperature does not rise DVT prophylaxis with subcutaneous Lovenox Resume home medications Follow-up cultures Supportive care  All the records are reviewed and case discussed with ED  provider. Management plans discussed with the patient, family and they are in agreement.  CODE STATUS:FULL    Code Status Orders        Start     Ordered   07/18/15 0710  Full code   Continuous     07/18/15 0710    Code Status History    Date Active Date Inactive Code Status Order ID Comments User Context   This patient has a current code status but no historical code status.       TOTAL TIME TAKING CARE OF THIS PATIENT: 54 minutes.    Ihor Austin M.D on 07/18/2015 at 7:26 AM  Between 7am to 6pm - Pager - [phone]  After 6pm go to [email or web address] - password [email or web address]  [facility]  Office  [phone]  CC: Primary care physician; Pcp Not In System

## 2015-07-18 NOTE — Progress Notes (Signed)
[facility] - [place] [facility]   PATIENT NAME: Kim Morgan    MR#:  [id number]  DATE OF BIRTH:  11-20-1945  SUBJECTIVE:  CHIEF COMPLAINT:   Chief Complaint  Patient presents with  . Seizures   Pt is demented, denied any symptoms REVIEW OF SYSTEMS:  Demented, not reliable ROS.  DRUG ALLERGIES:  No Known Allergies  VITALS:  Blood pressure 139/80, pulse 97, temperature 97.6 F (36.4 C), temperature source Oral, resp. rate 19, height 5\' 5"  (1.651 m), weight 170 lb (77.111 kg), SpO2 99 %.  PHYSICAL EXAMINATION:  GENERAL:  70 y.o.-year-old patient lying in the bed with no acute distress.  EYES: Pupils equal, round, reactive to light and accommodation. No scleral icterus. Extraocular muscles intact.  HEENT: Head atraumatic, normocephalic. Oropharynx and nasopharynx clear.  NECK:  Supple, no jugular venous distention. No thyroid enlargement, no tenderness.  LUNGS: Normal breath sounds bilaterally, no wheezing, rales,rhonchi or crepitation. No use of accessory muscles of respiration.  CARDIOVASCULAR: S1, S2 normal. No murmurs, rubs, or gallops.  ABDOMEN: Soft, nontender, nondistended. Bowel sounds present. No organomegaly or mass.  EXTREMITIES: No pedal edema, cyanosis, or clubbing.  NEUROLOGIC: Cranial nerves II through XII are intact. Muscle strength 4/5 in all extremities. Sensation intact. Gait not checked.  PSYCHIATRIC: The patient is demented.79  SKIN: No obvious rash, lesion, or ulcer.    LABORATORY PANEL:   CBC  Recent Labs Lab 07/18/15 0950  WBC 9.7  HGB 12.5  HCT 37.7  PLT 191   ------------------------------------------------------------------------------------------------------------------  Chemistries   Recent Labs Lab 07/18/15 0950  NA 139  K 3.6  CL 110  CO2 23  GLUCOSE 109*  BUN 11  CREATININE 0.77  CALCIUM 8.3*  AST 53*  ALT 44  ALKPHOS 69  BILITOT 0.6    ------------------------------------------------------------------------------------------------------------------  Cardiac Enzymes  Recent Labs Lab 07/18/15 0600  TROPONINI <0.03   ------------------------------------------------------------------------------------------------------------------  RADIOLOGY:  Ct Head Wo Contrast  07/18/2015  CLINICAL DATA:  Acute onset of witnessed seizure. Initial encounter. EXAM: CT HEAD WITHOUT CONTRAST TECHNIQUE: Contiguous axial images were obtained from the base of the skull through the vertex without intravenous contrast. COMPARISON:  CT of the head performed 05/20/2015 FINDINGS: There is no evidence of acute infarction, mass lesion, or intra- or extra-axial hemorrhage on CT. Prominence of ventricles and sulci reflects moderate cortical volume loss. Mild cerebellar atrophy is noted. Scattered periventricular and subcortical white matter change likely reflects small vessel ischemic microangiopathy. The brainstem and fourth ventricle are within normal limits. The basal ganglia are unremarkable in appearance. The cerebral hemispheres demonstrate grossly normal gray-white differentiation. No mass effect or midline shift is seen. There is no evidence of fracture; visualized osseous structures are unremarkable in appearance. The orbits are within normal limits. The paranasal sinuses and mastoid air cells are well-aerated. No significant soft tissue abnormalities are seen. IMPRESSION: 1. No acute intracranial pathology seen on CT. 2. Moderate cortical volume loss and scattered small vessel ischemic microangiopathy. Electronically Signed   By: 07/20/2015 M.D.   On: 07/18/2015 06:49   Dg Chest Port 1 View  07/18/2015  CLINICAL DATA:  Status post witnessed seizure. Concern for chest injury. Initial encounter. EXAM: PORTABLE CHEST 1 VIEW COMPARISON:  Chest radiograph performed 11/28/2007 FINDINGS: The lungs are hypoexpanded. Mild peribronchial thickening is noted.  Mild left basilar atelectasis is seen. No pleural effusion or pneumothorax is identified. The cardiomediastinal silhouette is normal in size. No acute osseous abnormalities are identified. IMPRESSION: Lungs hypoexpanded, with  mild peribronchial thickening and mild left basilar atelectasis. Electronically Signed   By: Roanna Raider M.D.   On: 07/18/2015 06:59    EKG:   Orders placed or performed during the hospital encounter of 07/18/15  . EKG 12-Lead  . EKG 12-Lead    ASSESSMENT AND PLAN:   70 year old female patient with history of them was dementia, hypertension, osteoporosis, fibromyalgia presented to the emergency room because of seizure-like activity and presyncopal event.  1. Sepsis and UTI. on IV vancomycin and IV Zosyn, Follow-up cultures.  2. Hypothermia secondary to sepsis. Improved. 3. Near syncope and Seizure-like activity, due to sepsis and UTI. 4. Lactic acidosis. Improved. 5. Dementia Aspiration and fall precaution.  Weakness. PT. All the records are reviewed and case discussed with Care Management/Social Workerr. Management plans discussed with the patient, her husband and they are in agreement.  CODE STATUS: full code.  TOTAL TIME TAKING CARE OF THIS PATIENT: 38 minutes.  Greater than 50% time was spent on coordination of care and face-to-face counseling.  POSSIBLE D/C IN 2 DAYS, DEPENDING ON CLINICAL CONDITION.   Shaune Pollack M.D on 07/18/2015 at 2:29 PM  Between 7am to 6pm - Pager - [phone]  After 6pm go to [email or web address] - password [email or web address]  [facility]  Office  [phone]  CC: Primary care physician; Pcp Not In System

## 2015-07-19 MED ORDER — DEXTROSE 5 % IV SOLN
1.0000 g | INTRAVENOUS | Status: DC
  Administered 2015-07-19 – 2015-07-21 (×3): 1 g via INTRAVENOUS
  Filled 2015-07-19 (×3): qty 10

## 2015-07-19 NOTE — NC FL2 (Signed)
[place] MEDICAID FL2 LEVEL OF CARE SCREENING TOOL     IDENTIFICATION  Patient Name: Kim Morgan Birthdate: February 15, 1945 Sex: female Admission Date (Current Location): 07/18/2015  [facility] and [place] Number:  [address]  ([id number]) Facility and Address:  [facility], [address], [place], [place] [address]      Provider Number: [id number]  Attending Physician Name and Address:  Shaune Pollack, MD  Relative Name and Phone Number:       Current Level of Care: Hospital Recommended Level of Care: Assisted Living Facility Prior Approval Number:    Date Approved/Denied:   PASRR Number:  [id number])  Discharge Plan: Domiciliary (Rest home)    Current Diagnoses: Patient Active Problem List   Diagnosis Date Noted  . Hypothermia 07/18/2015  . Sepsis (HCC) 07/18/2015    Orientation RESPIRATION BLADDER Height & Weight     Self  Normal Incontinent Weight: 170 lb (77.111 kg) Height:  5\' 5"  (165.1 cm)  BEHAVIORAL SYMPTOMS/MOOD NEUROLOGICAL BOWEL NUTRITION STATUS   (None) Convulsions/Seizures Incontinent Diet (2 gram sodium)  AMBULATORY STATUS COMMUNICATION OF NEEDS Skin   Limited Assist Verbally Normal                       Personal Care Assistance Level of Assistance  Bathing, Feeding, Dressing Bathing Assistance: Limited assistance Feeding assistance: Limited assistance Dressing Assistance: Limited assistance     Functional Limitations Info  Sight, Hearing, Speech Sight Info: Adequate Hearing Info: Adequate Speech Info: Adequate    SPECIAL CARE FACTORS FREQUENCY                       Contractures      Additional Factors Info  Code Status, Allergies Code Status Info:  (Full Code) Allergies Info:  (No Known Allergies)           Current Medications (07/19/2015):  This is the current hospital active medication list Current Facility-Administered Medications  Medication Dose Route Frequency Provider Last Rate Last Dose   . 0.9 %  sodium chloride infusion   Intravenous Continuous 09/18/2015, MD 75 mL/hr at 07/18/15 2111    . acetaminophen (TYLENOL) tablet 650 mg  650 mg Oral Q6H PRN 2112, MD       Or  . acetaminophen (TYLENOL) suppository 650 mg  650 mg Rectal Q6H PRN Pavan Pyreddy, MD      . buPROPion (WELLBUTRIN XL) 24 hr tablet 300 mg  300 mg Oral Daily Ihor Austin, MD   300 mg at 07/19/15 0944  . calcium-vitamin D (OSCAL WITH D) 500-200 MG-UNIT per tablet 1 tablet  1 tablet Oral BID 09/18/15, MD   1 tablet at 07/19/15 0939  . cefTRIAXone (ROCEPHIN) 1 g in dextrose 5 % 50 mL IVPB  1 g Intravenous Q24H 09/18/15, MD      . enoxaparin (LOVENOX) injection 40 mg  40 mg Subcutaneous Q24H Shaune Pollack, MD   40 mg at 07/19/15 0940  . folic acid (FOLVITE) tablet 1 mg  1 mg Oral Daily 09/18/15, MD   1 mg at 07/19/15 0939  . LORazepam (ATIVAN) tablet 1 mg  1 mg Oral Q8H Pavan Pyreddy, MD   1 mg at 07/19/15 0539  . multivitamin with minerals tablet 1 tablet  1 tablet Oral Daily 09/18/15, MD   1 tablet at 07/19/15 0939  . ondansetron (ZOFRAN) tablet 4 mg  4 mg Oral Q6H PRN 09/18/15, MD  Or  . ondansetron (ZOFRAN) injection 4 mg  4 mg Intravenous Q6H PRN Ihor Austin, MD      . oxybutynin (DITROPAN) tablet 5 mg  5 mg Oral TID Ihor Austin, MD   5 mg at 07/19/15 0939  . pantoprazole (PROTONIX) EC tablet 40 mg  40 mg Oral Daily Ihor Austin, MD   40 mg at 07/19/15 0939  . predniSONE (DELTASONE) tablet 5 mg  5 mg Oral Q breakfast Ihor Austin, MD   5 mg at 07/19/15 0939  . pregabalin (LYRICA) capsule 75 mg  75 mg Oral BID Ihor Austin, MD   75 mg at 07/19/15 0938  . senna-docusate (Senokot-S) tablet 1 tablet  1 tablet Oral QHS PRN Ihor Austin, MD      . sertraline (ZOLOFT) tablet 100 mg  100 mg Oral Daily Ihor Austin, MD   100 mg at 07/19/15 0939  . sodium chloride flush (NS) 0.9 % injection 3 mL  3 mL Intravenous Q12H Ihor Austin, MD   3 mL at 07/19/15 1000      Discharge Medications: Please see discharge summary for a list of discharge medications.  Relevant Imaging Results:  Relevant Lab Results:   Additional Information  (SSN [id number])  Verta Ellen Sunkins, LCSW

## 2015-07-19 NOTE — Progress Notes (Signed)
[facility] - [place] at [facility]   PATIENT NAME: Kim Morgan    MR#:  [id number]  DATE OF BIRTH:  1945/12/03  SUBJECTIVE:  CHIEF COMPLAINT:   Chief Complaint  Patient presents with  . Seizures   Pt is demented, denied any symptoms REVIEW OF SYSTEMS:  Demented, not reliable ROS.  DRUG ALLERGIES:  No Known Allergies  VITALS:  Blood pressure 125/60, pulse 78, temperature 98.1 F (36.7 C), temperature source Oral, resp. rate 19, height 5\' 5"  (1.651 m), weight 170 lb (77.111 kg), SpO2 99 %.  PHYSICAL EXAMINATION:  GENERAL:  70 y.o.-year-old patient lying in the bed with no acute distress.  EYES: Pupils equal, round, reactive to light and accommodation. No scleral icterus. Extraocular muscles intact.  HEENT: Head atraumatic, normocephalic. Oropharynx and nasopharynx clear.  NECK:  Supple, no jugular venous distention. No thyroid enlargement, no tenderness.  LUNGS: Normal breath sounds bilaterally, no wheezing, rales,rhonchi or crepitation. No use of accessory muscles of respiration.  CARDIOVASCULAR: S1, S2 normal. No murmurs, rubs, or gallops.  ABDOMEN: Soft, nontender, nondistended. Bowel sounds present. No organomegaly or mass.  EXTREMITIES: No pedal edema, cyanosis, or clubbing.  NEUROLOGIC: Cranial nerves II through XII are intact. Muscle strength 4/5 in all extremities. Sensation intact. Gait not checked.  PSYCHIATRIC: The patient is demented.68  SKIN: No obvious rash, lesion, or ulcer.    LABORATORY PANEL:   CBC  Recent Labs Lab 07/18/15 0950  WBC 9.7  HGB 12.5  HCT 37.7  PLT 191   ------------------------------------------------------------------------------------------------------------------  Chemistries   Recent Labs Lab 07/18/15 0950  NA 139  K 3.6  CL 110  CO2 23  GLUCOSE 109*  BUN 11  CREATININE 0.77  CALCIUM 8.3*  AST 53*  ALT 44  ALKPHOS 69  BILITOT 0.6    ------------------------------------------------------------------------------------------------------------------  Cardiac Enzymes  Recent Labs Lab 07/18/15 0600  TROPONINI <0.03   ------------------------------------------------------------------------------------------------------------------  RADIOLOGY:  Ct Head Wo Contrast  07/18/2015  CLINICAL DATA:  Acute onset of witnessed seizure. Initial encounter. EXAM: CT HEAD WITHOUT CONTRAST TECHNIQUE: Contiguous axial images were obtained from the base of the skull through the vertex without intravenous contrast. COMPARISON:  CT of the head performed 05/20/2015 FINDINGS: There is no evidence of acute infarction, mass lesion, or intra- or extra-axial hemorrhage on CT. Prominence of ventricles and sulci reflects moderate cortical volume loss. Mild cerebellar atrophy is noted. Scattered periventricular and subcortical white matter change likely reflects small vessel ischemic microangiopathy. The brainstem and fourth ventricle are within normal limits. The basal ganglia are unremarkable in appearance. The cerebral hemispheres demonstrate grossly normal gray-white differentiation. No mass effect or midline shift is seen. There is no evidence of fracture; visualized osseous structures are unremarkable in appearance. The orbits are within normal limits. The paranasal sinuses and mastoid air cells are well-aerated. No significant soft tissue abnormalities are seen. IMPRESSION: 1. No acute intracranial pathology seen on CT. 2. Moderate cortical volume loss and scattered small vessel ischemic microangiopathy. Electronically Signed   By: 07/20/2015 M.D.   On: 07/18/2015 06:49   Dg Chest Port 1 View  07/18/2015  CLINICAL DATA:  Status post witnessed seizure. Concern for chest injury. Initial encounter. EXAM: PORTABLE CHEST 1 VIEW COMPARISON:  Chest radiograph performed 11/28/2007 FINDINGS: The lungs are hypoexpanded. Mild peribronchial thickening is noted.  Mild left basilar atelectasis is seen. No pleural effusion or pneumothorax is identified. The cardiomediastinal silhouette is normal in size. No acute osseous abnormalities are identified. IMPRESSION: Lungs hypoexpanded, with  mild peribronchial thickening and mild left basilar atelectasis. Electronically Signed   By: Roanna Raider M.D.   On: 07/18/2015 06:59    EKG:   Orders placed or performed during the hospital encounter of 07/18/15  . EKG 12-Lead  . EKG 12-Lead    ASSESSMENT AND PLAN:   70 year old female patient with history of them was dementia, hypertension, osteoporosis, fibromyalgia presented to the emergency room because of seizure-like activity and presyncopal event.  1. Sepsis and UTI. Discontinue IV vancomycin and IV Zosyn,start rocephin,  Follow-up cultures.  2. Hypothermia secondary to sepsis. Improved. 3. Near syncope and Seizure-like activity, due to sepsis and UTI. 4. Lactic acidosis. Improved. 5. Dementia Aspiration and fall precaution.  Weakness. PT suggests SNF. All the records are reviewed and case discussed with Care Management/Social Workerr. Management plans discussed with the patient, her husband and they are in agreement.  CODE STATUS: full code.  TOTAL TIME TAKING CARE OF THIS PATIENT: 33 minutes.  Greater than 50% time was spent on coordination of care and face-to-face counseling.  POSSIBLE D/C IN 2 DAYS, DEPENDING ON CLINICAL CONDITION.   Shaune Pollack M.D on 07/19/2015 at 10:59 AM  Between 7am to 6pm - Pager - [phone]  After 6pm go to [email or web address] - password [email or web address]  [facility]  Office  [phone]  CC: Primary care physician; Pcp Not In System

## 2015-07-19 NOTE — Progress Notes (Signed)
Called Dr Imogene Burn per NT  Patient started to violently shake as the were moving her in legs and arms for about 30 seconds, patient continued to talk as this was happening, per MD no new orders continue to monitor

## 2015-07-20 ENCOUNTER — Inpatient Hospital Stay: Payer: Medicare Other

## 2015-07-20 DIAGNOSIS — G9340 Encephalopathy, unspecified: Secondary | ICD-10-CM

## 2015-07-20 LAB — URINE CULTURE

## 2015-07-20 MED ORDER — POLYVINYL ALCOHOL 1.4 % OP SOLN
1.0000 [drp] | Freq: Three times a day (TID) | OPHTHALMIC | Status: DC
  Administered 2015-07-20 – 2015-07-21 (×3): 1 [drp] via OPHTHALMIC
  Filled 2015-07-20: qty 15

## 2015-07-20 MED ORDER — HALOPERIDOL 0.5 MG PO TABS: 0.5000 mg | ORAL_TABLET | ORAL | Status: DC | PRN

## 2015-07-20 MED ORDER — QUETIAPINE FUMARATE 25 MG PO TABS
100.0000 mg | ORAL_TABLET | Freq: Every day | ORAL | Status: DC
  Administered 2015-07-20: 20:00:00 100 mg via ORAL
  Filled 2015-07-20: qty 4

## 2015-07-20 MED ORDER — SENNOSIDES-DOCUSATE SODIUM 8.6-50 MG PO TABS
1.0000 | ORAL_TABLET | Freq: Every day | ORAL | Status: DC
  Administered 2015-07-20 – 2015-07-21 (×2): 1 via ORAL
  Filled 2015-07-20 (×2): qty 1

## 2015-07-20 MED ORDER — FUROSEMIDE 20 MG PO TABS
20.0000 mg | ORAL_TABLET | Freq: Every day | ORAL | Status: DC
  Administered 2015-07-20 – 2015-07-21 (×2): 20 mg via ORAL
  Filled 2015-07-20 (×2): qty 1

## 2015-07-20 MED ORDER — ATORVASTATIN CALCIUM 20 MG PO TABS
20.0000 mg | ORAL_TABLET | Freq: Every day | ORAL | Status: DC
  Administered 2015-07-20: 17:00:00 20 mg via ORAL
  Filled 2015-07-20: qty 1

## 2015-07-20 MED ORDER — DOCUSATE SODIUM 100 MG PO CAPS
100.0000 mg | ORAL_CAPSULE | Freq: Every day | ORAL | Status: DC
  Administered 2015-07-20 – 2015-07-21 (×2): 100 mg via ORAL
  Filled 2015-07-20 (×2): qty 1

## 2015-07-20 MED ORDER — LORATADINE 10 MG PO TABS
10.0000 mg | ORAL_TABLET | Freq: Every day | ORAL | Status: DC
  Administered 2015-07-20 – 2015-07-21 (×2): 10 mg via ORAL
  Filled 2015-07-20 (×2): qty 1

## 2015-07-20 NOTE — Care Management (Signed)
Admitted to [facility] with the diagnosis of urinary tract infection. A resident of [facility]. Followed by [facility] for Nursing and therapy.  Spoke with [employer]. States that she will accept Ms. Brzoska back into her facility. Ms. Kunst is incontinent of bowel and bladder at the assisted home facility. Either husband or Ms. Worth will transport. Gwenette Greet RN MSN CCM Care Management [phone]

## 2015-07-20 NOTE — Care Management Important Message (Signed)
Important Message  Patient Details  Name: Kim Morgan MRN: [id number] Date of Birth: 08-19-1945   Medicare Important Message Given:  Yes    Gwenette Greet, RN 07/20/2015, 8:39 AM

## 2015-07-20 NOTE — Plan of Care (Signed)
Problem: Skin Integrity: Goal: Risk for impaired skin integrity will decrease Outcome: Not Progressing MASD- noted to BL groin, antifungal powder being applied.

## 2015-07-20 NOTE — Evaluation (Signed)
Physical Therapy Evaluation Patient Details Name: Kim Morgan MRN: [id number] DOB: 01-01-1946 Today's Date: 07/20/2015   History of Present Illness  Kim Morgan is an 70 y.o. female with a known history of hypertension, as well as dementia, osteoarthritis, osteoporosis, rheumatoid arthritis, fibromyalgia presented to the emergency room after she was sent from assisted living facility for a seizure-like activity. Patient was sitting on the toilet and she had seizure-like activity, described as shaking. She was helped by the nursing staff to get off the toilet. No history of any head injury. Patient was brought to the emergency room for evaluation of body temperature was low around 95.38F which later on improved to 97.9F. Patient was started on broad-spectrum IV antibiotics for sepsis and lactate level was high. Patient has dementia and unable to give any history. Most of the history obtained from chart but family also present today and report that at the time of her shaking at the facility the patient "passed out" as well.  Has been noted to have some shaking while hospitalized as well.  Family reports that cognitively she is not at her baseline either. Does not recognize her ex-husband and cannot hold a coherent conversation. At baseline patient walks without an assistive device but needs help to eat, dress, bathe and use the bathroom. Ex-husband reports 3 falls in the last 12 months. At time of PT evaluation pt is AOx1 and increasingly agitated. History obtained from ex-husband  Clinical Impression  Pt is very confused during PT evaluation. She participates with therapist but only follows simple commands 30-40% of the time. Pt becomes increasingly agitated during evaluation. She requires modA+2 for all bed mobility and maxA+2 for attempted sit to standing. She is able to remain standing at EOB for very short period before collapsing back onto bed. Pt not appropriate to attempt bed exercises at this  time. Currently she would not be able to tolerate SNF placement. Given her dementia she would be best suited to return to ALF and resume HH PT services. If her confusion improves as her infection is treated and she demonstrates considerable mobility deficits it may be appropriate to consider SNF. Pt will benefit from skilled PT services to address deficits in strength, balance, and mobility in order to return to full function at home.     Follow Up Recommendations Home health PT;Other (comment) (Return to ALF with [facility] PT services)    Equipment Recommendations  None recommended by PT    Recommendations for Other Services       Precautions / Restrictions Precautions Precautions: Fall Restrictions Weight Bearing Restrictions: No      Mobility  Bed Mobility Overal bed mobility: Needs Assistance;+2 for physical assistance Bed Mobility: Supine to Sit;Sit to Supine     Supine to sit: Mod assist;+2 for physical assistance Sit to supine: Mod assist;+2 for physical assistance   General bed mobility comments: Pt demonstrates need for hand over hand and cues for all bed mobility. Leans heavily posteriorly requiring considerable assist initially to stay upright. Once she is able to scoot toward the EOB and place feet on the ground she remains sitting with CGA only.  Transfers Overall transfer level: Needs assistance Equipment used: 2 person hand held assist Transfers: Sit to/from Stand Sit to Stand: Max assist;+2 physical assistance         General transfer comment: Pt requiring heavy verbal and tactile cues as well as maxA+2 to come to standing. Hand over hand for proper hand placement. Pt leaning heavily  posteriorly with inability to remain upright in standing. Eventually knees buckle and pt comes down onto bed.   Ambulation/Gait             General Gait Details: Unable  Information systems manager [place] (Stroke Patients Only)       Balance  Overall balance assessment: Needs assistance Sitting-balance support: No upper extremity supported Sitting balance-Leahy Scale: Good     Standing balance support: No upper extremity supported Standing balance-Leahy Scale: Zero                               Pertinent Vitals/Pain Pain Assessment: Faces Faces Pain Scale: No hurt Pain Location: Pt denies pain and does not demonstrate any signs of pain Pain Intervention(s): Monitored during session    Home Living Family/patient expects to be discharged to:: Unsure                 Additional Comments: Pt lives at [facility].     Prior Function Level of Independence: Needs assistance   Gait / Transfers Assistance Needed: Pt ambulates limited facility distances with HHA or supervision for safety at baseline  ADL's / Homemaking Assistance Needed: Requires assist with ADLs/IADLs at facility        Hand Dominance        Extremity/Trunk Assessment   Upper Extremity Assessment: Difficult to assess due to impaired cognition           Lower Extremity Assessment: Difficult to assess due to impaired cognition         Communication   Communication: No difficulties  Cognition Arousal/Alertness: Awake/alert Behavior During Therapy: Agitated;Restless Overall Cognitive Status: Impaired/Different from baseline Area of Impairment: Orientation;Attention;Following commands Orientation Level: Disoriented to;Place;Time;Situation Current Attention Level: Alternating Memory: Decreased short-term memory Following Commands: Follows one step commands inconsistently       General Comments: Pt becomes increasingly agitated throughout session. She follows simple one-step commands approximately 30-40% of the time    General Comments      Exercises        Assessment/Plan    PT Assessment Patient needs continued PT services  PT Diagnosis Difficulty walking;Abnormality of gait;Generalized weakness   PT  Problem List Decreased strength;Decreased activity tolerance;Decreased balance;Decreased mobility;Decreased cognition  PT Treatment Interventions DME instruction;Gait training;Functional mobility training;Therapeutic activities;Therapeutic exercise;Balance training;Neuromuscular re-education;Cognitive remediation;Patient/family education   PT Goals (Current goals can be found in the Care Plan section) Acute Rehab PT Goals Patient Stated Goal: Return to prior level of mobility at ALF PT Goal Formulation: With family Time For Goal Achievement: 08/03/15 Potential to Achieve Goals: Fair    Frequency Min 2X/week   Barriers to discharge        Co-evaluation               End of Session Equipment Utilized During Treatment: Gait belt Activity Tolerance: Treatment limited secondary to agitation Patient left: in bed;with call bell/phone within reach;with bed alarm set;with family/visitor present;with nursing/sitter in room Nurse Communication: Mobility status         Time: 9563-8756 PT Time Calculation (min) (ACUTE ONLY): 18 min   Charges:   PT Evaluation $PT Eval Low Complexity: 1 Procedure     PT G Codes:       Sharalyn Ink Timmya Blazier PT, DPT   Virginie Josten 07/20/2015, 2:31 PM

## 2015-07-20 NOTE — Progress Notes (Signed)
[facility] - [place] [facility]   PATIENT NAME: Kim Morgan    MR#:  [id number]  DATE OF BIRTH:  25-Dec-1945  SUBJECTIVE:  CHIEF COMPLAINT:   Chief Complaint  Patient presents with  . Seizures   Pt is demented, not answered questions.  REVIEW OF SYSTEMS:  Demented, not reliable ROS.  DRUG ALLERGIES:  No Known Allergies  VITALS:  Blood pressure 118/69, pulse 101, temperature 100.2 F (37.9 C), temperature source Oral, resp. rate 18, height 5\' 5"  (1.651 m), weight 170 lb (77.111 kg), SpO2 98 %.  PHYSICAL EXAMINATION:  GENERAL:  70 y.o.-year-old patient lying in the bed with no acute distress.  EYES: Pupils equal, round, reactive to light and accommodation. No scleral icterus. Extraocular muscles intact.  HEENT: Head atraumatic, normocephalic. Oropharynx and nasopharynx clear.  NECK:  Supple, no jugular venous distention. No thyroid enlargement, no tenderness.  LUNGS: Normal breath sounds bilaterally, no wheezing, rales,rhonchi or crepitation. No use of accessory muscles of respiration.  CARDIOVASCULAR: S1, S2 normal. No murmurs, rubs, or gallops.  ABDOMEN: Soft, nontender, nondistended. Bowel sounds present. No organomegaly or mass.  EXTREMITIES: No pedal edema, cyanosis, or clubbing.  NEUROLOGIC: Cranial nerves II through XII are intact. Muscle strength 3-4/5 in all extremities. Sensation intact. Gait not checked.  PSYCHIATRIC: The patient is demented.79  SKIN: No obvious rash, lesion, or ulcer.    LABORATORY PANEL:   CBC  Recent Labs Lab 07/18/15 0950  WBC 9.7  HGB 12.5  HCT 37.7  PLT 191   ------------------------------------------------------------------------------------------------------------------  Chemistries   Recent Labs Lab 07/18/15 0950  NA 139  K 3.6  CL 110  CO2 23  GLUCOSE 109*  BUN 11  CREATININE 0.77  CALCIUM 8.3*  AST 53*  ALT 44  ALKPHOS 69  BILITOT 0.6    ------------------------------------------------------------------------------------------------------------------  Cardiac Enzymes  Recent Labs Lab 07/18/15 0600  TROPONINI <0.03   ------------------------------------------------------------------------------------------------------------------  RADIOLOGY:  No results found.  EKG:   Orders placed or performed during the hospital encounter of 07/18/15  . EKG 12-Lead  . EKG 12-Lead    ASSESSMENT AND PLAN:   70 year old female patient with history of them was dementia, hypertension, osteoporosis, fibromyalgia presented to the emergency room because of seizure-like activity and presyncopal event.  1. Sepsis and UTI. Discontinued IV vancomycin and IV Zosyn,started rocephin,  Negative blood cultures.  2. Hypothermia secondary to sepsis. Improved. 3. Near syncope and Seizure-like activity, due to sepsis and UTI. EEG, d/c wellbutrin per Dr. 79.  4. Lactic acidosis. Improved. 5. Dementia Aspiration and fall precaution.   Weakness. PT suggests HHPT in ALF.  I discussed with Dr. Jodi Mourning. All the records are reviewed and case discussed with Care Management/Social Workerr. Management plans discussed with the patient, her husband and they are in agreement.  CODE STATUS: full code.  TOTAL TIME TAKING CARE OF THIS PATIENT: 35 minutes.  Greater than 50% time was spent on coordination of care and face-to-face counseling.  POSSIBLE D/C IN 1-2 DAYS, DEPENDING ON CLINICAL CONDITION.   Jodi Mourning M.D on 07/20/2015 at 3:09 PM  Between 7am to 6pm - Pager - [phone]  After 6pm go to [email or web address] - password [email or web address]  01-20-1978 Hospitalists  Office  [phone]  CC: Primary care physician; Pcp Not In System

## 2015-07-20 NOTE — Consult Note (Signed)
Reason for Consult:Altrered mental status Referring Physician: Imogene Burn  CC: Altered mental status  HPI: Kim Morgan is an 70 y.o. female with a known history of hypertension, as well as dementia, osteoarthritis, osteoporosis, rheumatoid arthritis, fibromyalgia presented to the emergency room after she was sent from assisted living facility for a seizure-like activity. Patient was sitting on the toilet and she had seizure-like activity, described as shaking. She was helped by the nursing staff to get off the toilet. No history of any head injury. Patient was brought to the emergency room for evaluation of body temperature was low around 95.60F which later on improved to 97.41F. Patient was started on broad-spectrum IV antibiotics for sepsis and lactate level was high. Patient has dementia and unable to give any history. Most of the history obtained from chart but family also present today and report that at the time of her shaking at the facility the patient "passed out" as well.  Has been noted to have some shaking while hospitalized as well.  Family reports that cognitively she is not at her baseline either. Does not recognize her ex-husband and con not hold a coherent conversation.   At baseline patient walks without an assistive device but needs help to eat, dress, bathe and use the bathroom.    Past Medical History  Diagnosis Date  . Hypertension   . Dementia   . Osteoarthritis   . Depression     Past surgical history: s/p hysterectomy and ORIF of the right radius and ulna  Family history: Parents deceased.  Father with CAD s/p MI and PE.  Mother with stroke and breast cancer died of old age.  Sister and son alive.    Social History:  reports that she has quit smoking. She does not have any smokeless tobacco history on file. She reports that she does not drink alcohol or use illicit drugs.  No Known Allergies  Medications:  I have reviewed the patient's current medications. Prior to  Admission:  Prescriptions prior to admission  Medication Sig Dispense Refill Last Dose  . atorvastatin (LIPITOR) 20 MG tablet Take 20 mg by mouth daily.   07/17/2015 at Unknown time  . calcium-vitamin D (OSCAL) 250-125 MG-UNIT per tablet Take 1 tablet by mouth 2 (two) times daily.   07/17/2015 at Unknown time  . cetirizine (ZYRTEC) 10 MG tablet Take 10 mg by mouth daily.   07/17/2015 at Unknown time  . docusate sodium (COLACE) 100 MG capsule Take 100 mg by mouth daily.   07/17/2015 at Unknown time  . folic acid (FOLVITE) 1 MG tablet Take 1 mg by mouth daily.   07/17/2015 at Unknown time  . furosemide (LASIX) 20 MG tablet Take 20 mg by mouth daily.   07/17/2015 at Unknown time  . haloperidol (HALDOL) 0.5 MG tablet Take 0.5 mg by mouth every 4 (four) hours as needed for agitation.   unknown  . HYDROcodone-acetaminophen (NORCO/VICODIN) 5-325 MG tablet Take 0.5 tablets by mouth 2 (two) times daily.   07/17/2015 at Unknown time  . losartan-hydrochlorothiazide (HYZAAR) 50-12.5 MG per tablet Take 0.5 tablets by mouth daily.    07/17/2015 at Unknown time  . methotrexate (RHEUMATREX) 2.5 MG tablet Take 10 mg by mouth once a week. Caution:Chemotherapy. Protect from light.   07/13/2015  . Multiple Vitamin (DAILY VITE) TABS Take 1 tablet by mouth daily.   07/17/2015 at Unknown time  . nystatin (NYSTATIN) powder Apply topically See admin instructions. Apply to areas that are inflammed and look yeast-like  topically 2 times a day. Apply until rash resolved. Use it as needed, thereafter.   07/17/2015 at Unknown time  . ondansetron (ZOFRAN) 4 MG tablet Take 1 tablet (4 mg total) by mouth daily as needed for nausea or vomiting. 20 tablet 1 unknown  . Polyethyl Glycol-Propyl Glycol (SYSTANE ULTRA) 0.4-0.3 % SOLN Apply 1 drop to eye 3 (three) times daily.   07/17/2015 at Unknown time  . pregabalin (LYRICA) 75 MG capsule Take 75 mg by mouth daily.    07/17/2015 at Unknown time  . QUEtiapine (SEROQUEL) 50 MG tablet Take 100 mg by mouth at  bedtime.   07/17/2015 at Unknown time  . ranitidine (ZANTAC) 150 MG tablet Take 150 mg by mouth at bedtime.   07/17/2015 at Unknown time  . sertraline (ZOLOFT) 100 MG tablet Take 100 mg by mouth every morning.    07/17/2015 at Unknown time   Scheduled: . calcium-vitamin D  1 tablet Oral BID  . cefTRIAXone (ROCEPHIN)  IV  1 g Intravenous Q24H  . enoxaparin (LOVENOX) injection  40 mg Subcutaneous Q24H  . folic acid  1 mg Oral Daily  . LORazepam  1 mg Oral Q8H  . multivitamin with minerals  1 tablet Oral Daily  . oxybutynin  5 mg Oral TID  . pantoprazole  40 mg Oral Daily  . predniSONE  5 mg Oral Q breakfast  . pregabalin  75 mg Oral BID  . sertraline  100 mg Oral Daily  . sodium chloride flush  3 mL Intravenous Q12H    ROS: Unable to obtain secondary to mental status  Physical Examination: Blood pressure 161/91, pulse 62, temperature 98.6 F (37 C), temperature source Oral, resp. rate 18, height 5\' 5"  (1.651 m), weight 77.111 kg (170 lb), SpO2 90 %.  HEENT-  Normocephalic, no lesions, without obvious abnormality.  Normal external eye and conjunctiva.  Normal TM's bilaterally.  Normal auditory canals and external ears. Normal external nose, mucus membranes and septum.  Normal pharynx. Cardiovascular- S1, S2 normal, pulses palpable throughout   Lungs- wheezing Abdomen- soft, non-tender; bowel sounds normal; no masses,  no organomegaly Extremities- no edema Lymph-no adenopathy palpable Musculoskeletal-no joint tenderness, deformity or swelling Skin-warm and dry, no hyperpigmentation, vitiligo, or suspicious lesions  Neurological Examination Mental Status: Lying in bed with eyes closed.  Does not follow commands.  Speech mumbling but at times comprehensible and appropriate to situation.  At times appears to be speaking to someone that is not there, looking at the ceiling.   Cranial Nerves: II: Discs flat bilaterally; Unable to test visual fields, pupils equal, round, reactive to light and  accommodation III,IV, VI: ptosis not present, extra-ocular motions unable to be tested V,VII: grimace symmetric but easier to open the left eye actively as compared to the right VIII: unable to test IX,X: unable tot test XI: unable to test XII: unable to test Motor: Unable to formally test but unable to move extremities against gravity.   Sensory: Responds to noxious stimuli in all extremities Deep Tendon Reflexes: 2+ and symmetric with absent AJ's bilaterally Plantars: Right: downgoing   Left: downgoing Cerebellar: Unable to test Gait: not tested due to safety concerns    Laboratory Studies:   Basic Metabolic Panel:  Recent Labs Lab 07/18/15 0600 07/18/15 0950  NA 139 139  K 3.6 3.6  CL 106 110  CO2 18* 23  GLUCOSE 147* 109*  BUN 13 11  CREATININE 0.98 0.77  CALCIUM 8.9 8.3*    Liver Function Tests:  Recent Labs Lab 07/18/15 0600 07/18/15 0950  AST 58* 53*  ALT 42 44  ALKPHOS 72 69  BILITOT 0.6 0.6  PROT 8.2* 7.8  ALBUMIN 3.8 3.6   No results for input(s): LIPASE, AMYLASE in the last 168 hours. No results for input(s): AMMONIA in the last 168 hours.  CBC:  Recent Labs Lab 07/18/15 0600 07/18/15 0950  WBC 5.7 9.7  NEUTROABS 3.4 8.2*  HGB 13.1 12.5  HCT 39.3 37.7  MCV 89.5 86.7  PLT 188 191    Cardiac Enzymes:  Recent Labs Lab 07/18/15 0600  TROPONINI <0.03    BNP: Invalid input(s): POCBNP  CBG: No results for input(s): GLUCAP in the last 168 hours.  Microbiology: Results for orders placed or performed during the hospital encounter of 07/18/15  Blood Culture (routine x 2)     Status: None (Preliminary result)   Collection Time: 07/18/15  6:00 AM  Result Value Ref Range Status   Specimen Description BLOOD LEFT FOREARM  Final   Special Requests   Final    BOTTLES DRAWN AEROBIC AND ANAEROBIC 5CCAERO,5CCANAS   Culture NO GROWTH 2 DAYS  Final   Report Status PENDING  Incomplete  Urine culture     Status: Abnormal   Collection Time:  07/18/15  6:02 AM  Result Value Ref Range Status   Specimen Description URINE, RANDOM  Final   Special Requests NONE  Final   Culture MULTIPLE SPECIES PRESENT, SUGGEST RECOLLECTION (A)  Final   Report Status 07/20/2015 FINAL  Final  Blood Culture (routine x 2)     Status: None (Preliminary result)   Collection Time: 07/18/15  6:10 AM  Result Value Ref Range Status   Specimen Description BLOOD LEFT HAND  Final   Special Requests BOTTLES DRAWN AEROBIC AND ANAEROBIC 5CCAERO,5CCANA  Final   Culture NO GROWTH 2 DAYS  Final   Report Status PENDING  Incomplete  Culture, blood (x 2)     Status: None (Preliminary result)   Collection Time: 07/18/15  9:44 AM  Result Value Ref Range Status   Specimen Description BLOOD LEFT ANTECUBITAL  Final   Special Requests   Final    BOTTLES DRAWN AEROBIC AND ANAEROBIC  AER 3CC ANA 1CC   Culture NO GROWTH 2 DAYS  Final   Report Status PENDING  Incomplete  Culture, blood (x 2)     Status: None (Preliminary result)   Collection Time: 07/18/15  9:50 AM  Result Value Ref Range Status   Specimen Description BLOOD RIGHT ANTECUBITAL  Final   Special Requests   Final    BOTTLES DRAWN AEROBIC AND ANAEROBIC  AER 5CC ANA 3CC   Culture NO GROWTH 2 DAYS  Final   Report Status PENDING  Incomplete  MRSA PCR Screening     Status: None   Collection Time: 07/18/15  5:00 PM  Result Value Ref Range Status   MRSA by PCR NEGATIVE NEGATIVE Final    Comment:        The GeneXpert MRSA Assay (FDA approved for NASAL specimens only), is one component of a comprehensive MRSA colonization surveillance program. It is not intended to diagnose MRSA infection nor to guide or monitor treatment for MRSA infections.     Coagulation Studies:  Recent Labs  07/18/15 0950  LABPROT 14.7  INR 1.13    Urinalysis:  Recent Labs Lab 07/18/15 0602  COLORURINE YELLOW*  LABSPEC 1.013  PHURINE 5.0  GLUCOSEU NEGATIVE  HGBUR NEGATIVE  BILIRUBINUR NEGATIVE  KETONESUR NEGATIVE  PROTEINUR 30*  NITRITE NEGATIVE  LEUKOCYTESUR 2+*    Lipid Panel:  No results found for: CHOL, TRIG, HDL, CHOLHDL, VLDL, LDLCALC  HgbA1C: No results found for: HGBA1C  Urine Drug Screen:  No results found for: LABOPIA, COCAINSCRNUR, LABBENZ, AMPHETMU, THCU, LABBARB  Alcohol Level: No results for input(s): ETH in the last 168 hours.  Other results: EKG: sinus tachycardia at 104 bpm.  Imaging: No results found.   Assessment/Plan: 70 year old female with a history of dementia admitted after having a shaking episode.  Has had a precipitous cognitive decline which likely explains her shaking episodes as well.  Cognition and CNS function will lag behind treatment.  Unclear what was visualized at the facility but patient was in the sitting position.  Shaking that nursing is concerned about at this time does not appear epileptic in origin.  Will rule out seizure with further testing.  Anticonvulsant therapy not indicated at this time.   Head CT personally reviewed and shows no acute changes.    Recommendations: 1.  EEG 2.  Agree with continued treatment of infection 3.  D/C Wellbutrin  Thana Farr, MD Neurology [phone] 07/20/2015, 1:31 PM

## 2015-07-21 MED ORDER — CIPROFLOXACIN HCL 500 MG PO TABS: 500.0000 mg | ORAL_TABLET | Freq: Two times a day (BID) | ORAL | Status: DC

## 2015-07-21 MED ORDER — HALOPERIDOL 0.5 MG PO TABS: 0.5000 mg | ORAL_TABLET | ORAL | Status: DC | PRN

## 2015-07-21 NOTE — Discharge Instructions (Signed)
Heart healthy diet. Activity as tolerated. HHPT, fall and aspiration precaution.

## 2015-07-21 NOTE — Clinical Social Work Note (Signed)
Clinical Social Work Assessment  Patient Details  Name: Kim Morgan MRN: [id number] Date of Birth: Nov 23, 1945  Date of referral:  07/21/15               Reason for consult:  Facility Placement                Permission sought to share information with:    Permission granted to share information::     Name::        Agency::     Relationship::     Contact Information:     Housing/Transportation Living arrangements for the past 2 months:  Assisted Living Facility Source of Information:  Spouse Patient Interpreter Needed:  None Criminal Activity/Legal Involvement Pertinent to Current Situation/Hospitalization:  No - Comment as needed Significant Relationships:  Spouse Lives with:  Facility Resident Do you feel safe going back to the place where you live?  Yes Need for family participation in patient care:  Yes (Comment)  Care giving concerns:  Patient requires assistance with ADL's.   Social Worker assessment / plan:  Patient is a resident of [facility]. CSW has spoken to [facility] and she can take patient back. MD is discharging patient to return to [facility] today. [facility] Nixon stated that they can transport patient. CSW has sent discharge information and explained where the MD signature is on the FL2. CSW has contacted patient's husband and he is aware of discharge and [facility] to transport.   Employment status:  Retired Health and safety inspector:  Medicare PT Recommendations:  Home with Home Health Information / Referral to community resources:     Patient/Family's Response to care:  Patient's husband expressed appreciation for CSW assistance.  Patient/Family's Understanding of and Emotional Response to Diagnosis, Current Treatment, and Prognosis:  Patient's husband verbalized agreement with his wife discharging to return to [facility].  Emotional Assessment Appearance:  Appears stated age Attitude/Demeanor/Rapport:    Affect (typically observed):  Unable to  Assess Orientation:  Oriented to Self Alcohol / Substance use:  Not Applicable Psych involvement (Current and /or in the community):  No (Comment)  Discharge Needs  Concerns to be addressed:  Care Coordination Readmission within the last 30 days:  No Current discharge risk:  None Barriers to Discharge:  No Barriers Identified   York Spaniel, LCSW 07/21/2015, 10:34 AM

## 2015-07-21 NOTE — Discharge Summary (Signed)
[facility] - [place] at [facility]   PATIENT NAME: Kim Morgan    MR#:  [id number]  DATE OF BIRTH:  04/09/1945  DATE OF ADMISSION:  07/18/2015 ADMITTING PHYSICIAN: Ihor Austin, MD  DATE OF DISCHARGE: 07/21/2015 PRIMARY CARE PHYSICIAN: Pcp Not In System    ADMISSION DIAGNOSIS:  UTI (lower urinary tract infection) [N39.0] Sepsis, due to unspecified organism (HCC) [A41.9]   DISCHARGE DIAGNOSIS:   Sepsis and UTI. SECONDARY DIAGNOSIS:   Past Medical History  Diagnosis Date  . Hypertension   . Dementia   . Osteoarthritis   . Depression     HOSPITAL COURSE:  70 year old female patient with history of them was dementia, hypertension, osteoporosis, fibromyalgia presented to the emergency room because of seizure-like activity and presyncopal event.  1. Sepsis and UTI. Discontinued IV vancomycin and IV Zosyn,started rocephin, Negative blood and urine cultures. Change to po cipro.  2. Hypothermia secondary to sepsis. Improved. 3. Near syncope and Seizure-like activity, due to sepsis and UTI. EEG, d/c wellbutrin per Dr. Jodi Mourning.  4. Lactic acidosis. Improved. 5. Dementia Aspiration and fall precaution.  Weakness. PT suggests HHPT in ALF.  DISCHARGE CONDITIONS:   Stable, discharge to ALF with HHPT.  CONSULTS OBTAINED:  Treatment Team:  Thana Farr, MD  DRUG ALLERGIES:  No Known Allergies  DISCHARGE MEDICATIONS:   Current Discharge Medication List    START taking these medications   Details  ciprofloxacin (CIPRO) 500 MG tablet Take 1 tablet (500 mg total) by mouth 2 (two) times daily. Qty: 6 tablet, Refills: 0      CONTINUE these medications which have CHANGED   Details  haloperidol (HALDOL) 0.5 MG tablet Take 1 tablet (0.5 mg total) by mouth every 4 (four) hours as needed for agitation. Qty: 16 tablet, Refills: 0      CONTINUE these medications which have NOT CHANGED   Details  atorvastatin (LIPITOR) 20 MG tablet Take 20  mg by mouth daily.    calcium-vitamin D (OSCAL) 250-125 MG-UNIT per tablet Take 1 tablet by mouth 2 (two) times daily.    cetirizine (ZYRTEC) 10 MG tablet Take 10 mg by mouth daily.    docusate sodium (COLACE) 100 MG capsule Take 100 mg by mouth daily.    folic acid (FOLVITE) 1 MG tablet Take 1 mg by mouth daily.    furosemide (LASIX) 20 MG tablet Take 20 mg by mouth daily.    losartan-hydrochlorothiazide (HYZAAR) 50-12.5 MG per tablet Take 0.5 tablets by mouth daily.     methotrexate (RHEUMATREX) 2.5 MG tablet Take 10 mg by mouth once a week. Caution:Chemotherapy. Protect from light.    Multiple Vitamin (DAILY VITE) TABS Take 1 tablet by mouth daily.    nystatin (NYSTATIN) powder Apply topically See admin instructions. Apply to areas that are inflammed and look yeast-like topically 2 times a day. Apply until rash resolved. Use it as needed, thereafter.    ondansetron (ZOFRAN) 4 MG tablet Take 1 tablet (4 mg total) by mouth daily as needed for nausea or vomiting. Qty: 20 tablet, Refills: 1    Polyethyl Glycol-Propyl Glycol (SYSTANE ULTRA) 0.4-0.3 % SOLN Apply 1 drop to eye 3 (three) times daily.    pregabalin (LYRICA) 75 MG capsule Take 75 mg by mouth daily.     QUEtiapine (SEROQUEL) 50 MG tablet Take 100 mg by mouth at bedtime.    ranitidine (ZANTAC) 150 MG tablet Take 150 mg by mouth at bedtime.    sertraline (ZOLOFT) 100 MG tablet Take 100  mg by mouth every morning.       STOP taking these medications     HYDROcodone-acetaminophen (NORCO/VICODIN) 5-325 MG tablet      lansoprazole (PREVACID) 15 MG capsule      oxybutynin (DITROPAN) 5 MG tablet      predniSONE (DELTASONE) 5 MG tablet          DISCHARGE INSTRUCTIONS:    If you experience worsening of your admission symptoms, develop shortness of breath, life threatening emergency, suicidal or homicidal thoughts you must seek medical attention immediately by calling 911 or calling your MD immediately  if symptoms  less severe.  You Must read complete instructions/literature along with all the possible adverse reactions/side effects for all the Medicines you take and that have been prescribed to you. Take any new Medicines after you have completely understood and accept all the possible adverse reactions/side effects.   Please note  You were cared for by a hospitalist during your hospital stay. If you have any questions about your discharge medications or the care you received while you were in the hospital after you are discharged, you can call the unit and asked to speak with the hospitalist on call if the hospitalist that took care of you is not available. Once you are discharged, your primary care physician will handle any further medical issues. Please note that NO REFILLS for any discharge medications will be authorized once you are discharged, as it is imperative that you return to your primary care physician (or establish a relationship with a primary care physician if you do not have one) for your aftercare needs so that they can reassess your need for medications and monitor your lab values.    Today   SUBJECTIVE    Demented.  VITAL SIGNS:  Blood pressure 163/89, pulse 80, temperature 97.8 F (36.6 C), temperature source Oral, resp. rate 18, height 5\' 5"  (1.651 m), weight 170 lb (77.111 kg), SpO2 96 %.  I/O:   Intake/Output Summary (Last 24 hours) at 07/21/15 1005 Last data filed at 07/20/15 1900  Gross per 24 hour  Intake    480 ml  Output      0 ml  Net    480 ml    PHYSICAL EXAMINATION:  GENERAL:  70 y.o.-year-old patient lying in the bed with no acute distress.  EYES: Pupils equal, round, reactive to light and accommodation. No scleral icterus. Extraocular muscles intact.  HEENT: Head atraumatic, normocephalic. Oropharynx and nasopharynx clear.  NECK:  Supple, no jugular venous distention. No thyroid enlargement, no tenderness.  LUNGS: Normal breath sounds bilaterally, no  wheezing, rales,rhonchi or crepitation. No use of accessory muscles of respiration.  CARDIOVASCULAR: S1, S2 normal. No murmurs, rubs, or gallops.  ABDOMEN: Soft, non-tender, non-distended. Bowel sounds present. No organomegaly or mass.  EXTREMITIES: No pedal edema, cyanosis, or clubbing.  NEUROLOGIC: unable to exam. PSYCHIATRIC: The patient is demented. SKIN: No obvious rash, lesion, or ulcer.   DATA REVIEW:   CBC  Recent Labs Lab 07/18/15 0950  WBC 9.7  HGB 12.5  HCT 37.7  PLT 191    Chemistries   Recent Labs Lab 07/18/15 0950  NA 139  K 3.6  CL 110  CO2 23  GLUCOSE 109*  BUN 11  CREATININE 0.77  CALCIUM 8.3*  AST 53*  ALT 44  ALKPHOS 69  BILITOT 0.6    Cardiac Enzymes  Recent Labs Lab 07/18/15 0600  TROPONINI <0.03    Microbiology Results  Results for orders placed  or performed during the hospital encounter of 07/18/15  Blood Culture (routine x 2)     Status: None (Preliminary result)   Collection Time: 07/18/15  6:00 AM  Result Value Ref Range Status   Specimen Description BLOOD LEFT FOREARM  Final   Special Requests   Final    BOTTLES DRAWN AEROBIC AND ANAEROBIC 5CCAERO,5CCANAS   Culture NO GROWTH 3 DAYS  Final   Report Status PENDING  Incomplete  Urine culture     Status: Abnormal   Collection Time: 07/18/15  6:02 AM  Result Value Ref Range Status   Specimen Description URINE, RANDOM  Final   Special Requests NONE  Final   Culture MULTIPLE SPECIES PRESENT, SUGGEST RECOLLECTION (A)  Final   Report Status 07/20/2015 FINAL  Final  Blood Culture (routine x 2)     Status: None (Preliminary result)   Collection Time: 07/18/15  6:10 AM  Result Value Ref Range Status   Specimen Description BLOOD LEFT HAND  Final   Special Requests BOTTLES DRAWN AEROBIC AND ANAEROBIC 5CCAERO,5CCANA  Final   Culture NO GROWTH 3 DAYS  Final   Report Status PENDING  Incomplete  Culture, blood (x 2)     Status: None (Preliminary result)   Collection Time: 07/18/15   9:44 AM  Result Value Ref Range Status   Specimen Description BLOOD LEFT ANTECUBITAL  Final   Special Requests   Final    BOTTLES DRAWN AEROBIC AND ANAEROBIC  AER 3CC ANA 1CC   Culture NO GROWTH 3 DAYS  Final   Report Status PENDING  Incomplete  Culture, blood (x 2)     Status: None (Preliminary result)   Collection Time: 07/18/15  9:50 AM  Result Value Ref Range Status   Specimen Description BLOOD RIGHT ANTECUBITAL  Final   Special Requests   Final    BOTTLES DRAWN AEROBIC AND ANAEROBIC  AER 5CC ANA 3CC   Culture NO GROWTH 3 DAYS  Final   Report Status PENDING  Incomplete  MRSA PCR Screening     Status: None   Collection Time: 07/18/15  5:00 PM  Result Value Ref Range Status   MRSA by PCR NEGATIVE NEGATIVE Final    Comment:        The GeneXpert MRSA Assay (FDA approved for NASAL specimens only), is one component of a comprehensive MRSA colonization surveillance program. It is not intended to diagnose MRSA infection nor to guide or monitor treatment for MRSA infections.     RADIOLOGY:  No results found.      Management plans discussed with the patient, family and they are in agreement.  CODE STATUS:     Code Status Orders        Start     Ordered   07/18/15 0710  Full code   Continuous     07/18/15 0710    Code Status History    Date Active Date Inactive Code Status Order ID Comments User Context   This patient has a current code status but no historical code status.      TOTAL TIME TAKING CARE OF THIS PATIENT: 35 minutes.    Shaune Pollack M.D on 07/21/2015 at 10:05 AM  Between 7am to 6pm - Pager - [phone]  After 6pm go to [email or web address] - password [email or web address]  [facility]  Office  [phone]  CC: Primary care physician; Pcp Not In System

## 2015-07-21 NOTE — NC FL2 (Signed)
[place] MEDICAID FL2 LEVEL OF CARE SCREENING TOOL     IDENTIFICATION  Patient Name: Kim Morgan Birthdate: 1945-12-08 Sex: female Admission Date (Current Location): 07/18/2015  [facility] and [place] Number:  [address]  ([id number]) Facility and Address:  [facility], [address] [place], [place] [address]      Provider Number: [id number]  Attending Physician Name and Address:  Shaune Pollack, MD  Relative Name and Phone Number:       Current Level of Care: Hospital Recommended Level of Care: Assisted Living Facility Prior Approval Number:    Date Approved/Denied:   PASRR Number:  [id number])  Discharge Plan: Domiciliary (Rest home)    Current Diagnoses: Patient Active Problem List   Diagnosis Date Noted  . Acute encephalopathy   . Hypothermia 07/18/2015  . Sepsis (HCC) 07/18/2015    Orientation RESPIRATION BLADDER Height & Weight     Self  Normal Incontinent Weight: 170 lb (77.111 kg) Height:  5\' 5"  (165.1 cm)  BEHAVIORAL SYMPTOMS/MOOD NEUROLOGICAL BOWEL NUTRITION STATUS   (None) Convulsions/Seizures Incontinent Diet (2 gram sodium)  AMBULATORY STATUS COMMUNICATION OF NEEDS Skin   Limited Assist Verbally Normal                       Personal Care Assistance Level of Assistance  Bathing, Feeding, Dressing Bathing Assistance: Limited assistance Feeding assistance: Limited assistance Dressing Assistance: Limited assistance     Functional Limitations Info  Sight, Hearing, Speech Sight Info: Adequate Hearing Info: Adequate Speech Info: Adequate    SPECIAL CARE FACTORS FREQUENCY   Patient to have Home Health through [facility]                    Contractures      Additional Factors Info  Code Status, Allergies Code Status Info:  (Full Code) Allergies Info:  (No Known Allergies)           DISCHARGE MEDICATIONS:   Current Discharge Medication List    START taking these medications   Details   ciprofloxacin (CIPRO) 500 MG tablet Take 1 tablet (500 mg total) by mouth 2 (two) times daily. Qty: 6 tablet, Refills: 0      CONTINUE these medications which have CHANGED   Details  haloperidol (HALDOL) 0.5 MG tablet Take 1 tablet (0.5 mg total) by mouth every 4 (four) hours as needed for agitation. Qty: 16 tablet, Refills: 0      CONTINUE these medications which have NOT CHANGED   Details  atorvastatin (LIPITOR) 20 MG tablet Take 20 mg by mouth daily.    calcium-vitamin D (OSCAL) 250-125 MG-UNIT per tablet Take 1 tablet by mouth 2 (two) times daily.    cetirizine (ZYRTEC) 10 MG tablet Take 10 mg by mouth daily.    docusate sodium (COLACE) 100 MG capsule Take 100 mg by mouth daily.    folic acid (FOLVITE) 1 MG tablet Take 1 mg by mouth daily.    furosemide (LASIX) 20 MG tablet Take 20 mg by mouth daily.    losartan-hydrochlorothiazide (HYZAAR) 50-12.5 MG per tablet Take 0.5 tablets by mouth daily.     methotrexate (RHEUMATREX) 2.5 MG tablet Take 10 mg by mouth once a week. Caution:Chemotherapy. Protect from light.    Multiple Vitamin (DAILY VITE) TABS Take 1 tablet by mouth daily.    nystatin (NYSTATIN) powder Apply topically See admin instructions. Apply to areas that are inflammed and look yeast-like topically 2 times a day. Apply until rash resolved.  Use it as needed, thereafter.    ondansetron (ZOFRAN) 4 MG tablet Take 1 tablet (4 mg total) by mouth daily as needed for nausea or vomiting. Qty: 20 tablet, Refills: 1    Polyethyl Glycol-Propyl Glycol (SYSTANE ULTRA) 0.4-0.3 % SOLN Apply 1 drop to eye 3 (three) times daily.    pregabalin (LYRICA) 75 MG capsule Take 75 mg by mouth daily.     QUEtiapine (SEROQUEL) 50 MG tablet Take 100 mg by mouth at bedtime.    ranitidine (ZANTAC) 150 MG tablet Take 150 mg by mouth at bedtime.    sertraline (ZOLOFT) 100 MG tablet Take 100 mg by mouth every morning.        STOP taking these medications     HYDROcodone-acetaminophen (NORCO/VICODIN) 5-325 MG tablet      lansoprazole (PREVACID) 15 MG capsule      oxybutynin (DITROPAN) 5 MG tablet      predniSONE (DELTASONE) 5 MG tablet             Discharge Medications: Please see discharge summary for a list of discharge medications.  Relevant Imaging Results:  Relevant Lab Results:   Additional Information  (SSN [id number])  York Spaniel, LCSW

## 2015-07-21 NOTE — Progress Notes (Signed)
Patient discharged to [facility]. Prescriptions given to facility representative. All discharge instructions given to facility representative and all questions answered.

## 2015-07-23 LAB — CULTURE, BLOOD (ROUTINE X 2)
CULTURE: NO GROWTH
Culture: NO GROWTH
Culture: NO GROWTH
Culture: NO GROWTH

## 2016-05-13 ENCOUNTER — Inpatient Hospital Stay
Admission: EM | Admit: 2016-05-13 | Discharge: 2016-05-16 | DRG: 871 | Disposition: A | Discharge status: Home Health | Payer: Medicare Other | Attending: Internal Medicine | Admitting: Internal Medicine

## 2016-05-13 ENCOUNTER — Emergency Department: Payer: Medicare Other

## 2016-05-13 ENCOUNTER — Encounter: Payer: Self-pay | Admitting: Emergency Medicine

## 2016-05-13 DIAGNOSIS — F0390 Unspecified dementia without behavioral disturbance: Secondary | ICD-10-CM | POA: Diagnosis present

## 2016-05-13 DIAGNOSIS — F329 Major depressive disorder, single episode, unspecified: Secondary | ICD-10-CM | POA: Diagnosis present

## 2016-05-13 DIAGNOSIS — E785 Hyperlipidemia, unspecified: Secondary | ICD-10-CM | POA: Diagnosis present

## 2016-05-13 DIAGNOSIS — M069 Rheumatoid arthritis, unspecified: Secondary | ICD-10-CM | POA: Diagnosis present

## 2016-05-13 DIAGNOSIS — I10 Essential (primary) hypertension: Secondary | ICD-10-CM | POA: Diagnosis present

## 2016-05-13 DIAGNOSIS — I959 Hypotension, unspecified: Secondary | ICD-10-CM | POA: Diagnosis present

## 2016-05-13 DIAGNOSIS — B9620 Unspecified Escherichia coli [E. coli] as the cause of diseases classified elsewhere: Secondary | ICD-10-CM | POA: Diagnosis present

## 2016-05-13 DIAGNOSIS — G9340 Encephalopathy, unspecified: Secondary | ICD-10-CM | POA: Diagnosis present

## 2016-05-13 DIAGNOSIS — R531 Weakness: Secondary | ICD-10-CM | POA: Diagnosis present

## 2016-05-13 DIAGNOSIS — E876 Hypokalemia: Secondary | ICD-10-CM | POA: Diagnosis present

## 2016-05-13 DIAGNOSIS — Z87891 Personal history of nicotine dependence: Secondary | ICD-10-CM

## 2016-05-13 DIAGNOSIS — A419 Sepsis, unspecified organism: Secondary | ICD-10-CM | POA: Diagnosis present

## 2016-05-13 DIAGNOSIS — M1990 Unspecified osteoarthritis, unspecified site: Secondary | ICD-10-CM | POA: Diagnosis present

## 2016-05-13 DIAGNOSIS — Z79899 Other long term (current) drug therapy: Secondary | ICD-10-CM

## 2016-05-13 DIAGNOSIS — N390 Urinary tract infection, site not specified: Secondary | ICD-10-CM | POA: Diagnosis present

## 2016-05-13 LAB — URINALYSIS, COMPLETE (UACMP) WITH MICROSCOPIC
BILIRUBIN URINE: NEGATIVE
Glucose, UA: NEGATIVE mg/dL
Hgb urine dipstick: NEGATIVE
Ketones, ur: NEGATIVE mg/dL
Nitrite: POSITIVE — AB
Protein, ur: 30 mg/dL — AB
SPECIFIC GRAVITY, URINE: 1.018 (ref 1.005–1.030)
pH: 5 (ref 5.0–8.0)

## 2016-05-13 LAB — COMPREHENSIVE METABOLIC PANEL
ALBUMIN: 3.7 g/dL (ref 3.5–5.0)
ALT: 16 U/L (ref 14–54)
AST: 24 U/L (ref 15–41)
Alkaline Phosphatase: 63 U/L (ref 38–126)
Anion gap: 10 (ref 5–15)
BILIRUBIN TOTAL: 0.9 mg/dL (ref 0.3–1.2)
BUN: 15 mg/dL (ref 6–20)
CHLORIDE: 103 mmol/L (ref 101–111)
CO2: 23 mmol/L (ref 22–32)
Calcium: 9 mg/dL (ref 8.9–10.3)
Creatinine, Ser: 0.59 mg/dL (ref 0.44–1.00)
GFR calc Af Amer: >60 mL/min (ref >60)
GFR calc non Af Amer: >60 mL/min (ref >60)
GLUCOSE: 125 mg/dL — AB (ref 65–99)
POTASSIUM: 3 mmol/L — AB (ref 3.5–5.1)
Sodium: 136 mmol/L (ref 135–145)
TOTAL PROTEIN: 8.1 g/dL (ref 6.5–8.1)

## 2016-05-13 LAB — CBC WITH DIFFERENTIAL/PLATELET
Basophils Absolute: 0 10*3/uL (ref 0–0.1)
Basophils Relative: 0 %
EOS PCT: 0 %
Eosinophils Absolute: 0 10*3/uL (ref 0–0.7)
HCT: 37.1 % (ref 35.0–47.0)
Hemoglobin: 12.5 g/dL (ref 12.0–16.0)
LYMPHS ABS: 0.9 10*3/uL — AB (ref 1.0–3.6)
LYMPHS PCT: 8 %
MCH: 29.8 pg (ref 26.0–34.0)
MCHC: 33.7 g/dL (ref 32.0–36.0)
MCV: 88.4 fL (ref 80.0–100.0)
MONO ABS: 0.9 10*3/uL (ref 0.2–0.9)
MONOS PCT: 8 %
Neutro Abs: 9.5 10*3/uL — ABNORMAL HIGH (ref 1.4–6.5)
Neutrophils Relative %: 84 %
PLATELETS: 232 10*3/uL (ref 150–440)
RBC: 4.2 MIL/uL (ref 3.80–5.20)
RDW: 14.6 % — AB (ref 11.5–14.5)
WBC: 11.4 10*3/uL — ABNORMAL HIGH (ref 3.6–11.0)

## 2016-05-13 LAB — INFLUENZA PANEL BY PCR (TYPE A & B)
INFLAPCR: NEGATIVE
Influenza B By PCR: NEGATIVE

## 2016-05-13 LAB — LACTIC ACID, PLASMA: Lactic Acid, Venous: 0.9 mmol/L (ref 0.5–1.9)

## 2016-05-13 MED ORDER — CEFEPIME-DEXTROSE 2 GM/50ML IV SOLR: 2.0000 g | Freq: Once | INTRAVENOUS | Status: DC

## 2016-05-13 MED ORDER — SERTRALINE HCL 50 MG PO TABS
100.0000 mg | ORAL_TABLET | ORAL | Status: DC
  Administered 2016-05-14 – 2016-05-16 (×3): 100 mg via ORAL
  Filled 2016-05-13 (×4): qty 2

## 2016-05-13 MED ORDER — DEXTROSE 5 % IV SOLN
1.0000 g | INTRAVENOUS | Status: DC
  Administered 2016-05-13 – 2016-05-15 (×3): 1 g via INTRAVENOUS
  Filled 2016-05-13 (×5): qty 10

## 2016-05-13 MED ORDER — ACETAMINOPHEN 650 MG RE SUPP
RECTAL | Status: AC
  Administered 2016-05-13: 650 mg
  Filled 2016-05-13: qty 1

## 2016-05-13 MED ORDER — DOCUSATE SODIUM 100 MG PO CAPS: 100.0000 mg | ORAL_CAPSULE | Freq: Every day | ORAL | Status: DC

## 2016-05-13 MED ORDER — DEXTROSE 5 % IV SOLN: 1.0000 g | INTRAVENOUS | Status: DC

## 2016-05-13 MED ORDER — SODIUM CHLORIDE 0.9 % IV SOLN
30.0000 meq | Freq: Once | INTRAVENOUS | Status: AC
  Administered 2016-05-13: 30 meq via INTRAVENOUS
  Filled 2016-05-13 (×2): qty 15

## 2016-05-13 MED ORDER — NYSTATIN 100000 UNIT/GM EX POWD
Freq: Two times a day (BID) | CUTANEOUS | Status: DC | PRN
  Filled 2016-05-13: qty 15

## 2016-05-13 MED ORDER — ATORVASTATIN CALCIUM 20 MG PO TABS
20.0000 mg | ORAL_TABLET | Freq: Every day | ORAL | Status: DC
  Administered 2016-05-13 – 2016-05-16 (×4): 20 mg via ORAL
  Filled 2016-05-13 (×4): qty 1

## 2016-05-13 MED ORDER — BISACODYL 5 MG PO TBEC: 5.0000 mg | DELAYED_RELEASE_TABLET | Freq: Every day | ORAL | Status: DC | PRN

## 2016-05-13 MED ORDER — SODIUM CHLORIDE 0.9 % IV SOLN
INTRAVENOUS | Status: DC
  Administered 2016-05-13 – 2016-05-15 (×4): via INTRAVENOUS

## 2016-05-13 MED ORDER — METHOTREXATE 2.5 MG PO TABS
10.0000 mg | ORAL_TABLET | ORAL | Status: DC
  Administered 2016-05-16: 10 mg via ORAL
  Filled 2016-05-13: qty 4

## 2016-05-13 MED ORDER — ACETAMINOPHEN 650 MG RE SUPP: 650.0000 mg | Freq: Four times a day (QID) | RECTAL | Status: DC | PRN

## 2016-05-13 MED ORDER — ACETAMINOPHEN 325 MG PO TABS: 650.0000 mg | ORAL_TABLET | Freq: Four times a day (QID) | ORAL | Status: DC | PRN

## 2016-05-13 MED ORDER — HEPARIN SODIUM (PORCINE) 5000 UNIT/ML IJ SOLN
5000.0000 [IU] | Freq: Three times a day (TID) | INTRAMUSCULAR | Status: DC
  Administered 2016-05-13 – 2016-05-16 (×8): 5000 [IU] via SUBCUTANEOUS
  Filled 2016-05-13 (×8): qty 1

## 2016-05-13 MED ORDER — SODIUM CHLORIDE 0.9 % IV SOLN
Freq: Once | INTRAVENOUS | Status: AC
  Administered 2016-05-13: 19:00:00 via INTRAVENOUS

## 2016-05-13 MED ORDER — DEXTROSE 5 % IV SOLN
2.0000 g | Freq: Once | INTRAVENOUS | Status: AC
  Administered 2016-05-13: 2 g via INTRAVENOUS
  Filled 2016-05-13 (×2): qty 2

## 2016-05-13 MED ORDER — CEFEPIME HCL 2 G IJ SOLR
2.0000 g | Freq: Once | INTRAMUSCULAR | Status: DC
Start: 2016-05-13 — End: 2016-05-13

## 2016-05-13 MED ORDER — ONDANSETRON HCL 4 MG/2ML IJ SOLN: 4.0000 mg | Freq: Four times a day (QID) | INTRAMUSCULAR | Status: DC | PRN

## 2016-05-13 MED ORDER — FOLIC ACID 1 MG PO TABS
1.0000 mg | ORAL_TABLET | Freq: Every day | ORAL | Status: DC
  Administered 2016-05-14 – 2016-05-16 (×3): 1 mg via ORAL
  Filled 2016-05-13 (×3): qty 1

## 2016-05-13 MED ORDER — DOCUSATE SODIUM 100 MG PO CAPS
100.0000 mg | ORAL_CAPSULE | Freq: Two times a day (BID) | ORAL | Status: DC
  Administered 2016-05-13 – 2016-05-15 (×5): 100 mg via ORAL
  Filled 2016-05-13 (×6): qty 1

## 2016-05-13 MED ORDER — ADULT MULTIVITAMIN W/MINERALS CH
1.0000 | ORAL_TABLET | Freq: Every day | ORAL | Status: DC
  Administered 2016-05-13 – 2016-05-16 (×4): 1 via ORAL
  Filled 2016-05-13 (×4): qty 1

## 2016-05-13 MED ORDER — ONDANSETRON HCL 4 MG PO TABS: 4.0000 mg | ORAL_TABLET | Freq: Four times a day (QID) | ORAL | Status: DC | PRN

## 2016-05-13 MED ORDER — MELATONIN 5 MG PO TABS
10.0000 mg | ORAL_TABLET | Freq: Every day | ORAL | Status: DC
  Administered 2016-05-13 – 2016-05-15 (×3): 10 mg via ORAL
  Filled 2016-05-13 (×4): qty 2

## 2016-05-13 NOTE — ED Provider Notes (Signed)
[facility] Provider Note  ____________________________________________   First MD Initiated Contact with Patient 05/13/16 1503     (approximate)  I have reviewed the triage vital signs and the nursing notes.   HISTORY  Chief Complaint Nasal Congestion   HPI Kim Morgan is a 71 y.o. female with a history of dementia and hypertension on methotrexate for rheumatoid arthritis who is presenting to the emergency department today with fever, weakness and cough. She is accompanied by her son who says that she is acting at her baseline. He says that she is very confused and disoriented set her baseline.   Past Medical History:  Diagnosis Date  . Dementia   . Depression   . Hypertension   . Osteoarthritis     Patient Active Problem List   Diagnosis Date Noted  . Acute encephalopathy   . Hypothermia 07/18/2015  . Sepsis (HCC) 07/18/2015    History reviewed. No pertinent surgical history.  Prior to Admission medications   Medication Sig Start Date End Date Taking? Authorizing Provider  atorvastatin (LIPITOR) 20 MG tablet Take 20 mg by mouth daily.    Historical Provider, MD  calcium-vitamin D (OSCAL) 250-125 MG-UNIT per tablet Take 1 tablet by mouth 2 (two) times daily.    Historical Provider, MD  cetirizine (ZYRTEC) 10 MG tablet Take 10 mg by mouth daily.    Historical Provider, MD  ciprofloxacin (CIPRO) 500 MG tablet Take 1 tablet (500 mg total) by mouth 2 (two) times daily. 07/21/15   Shaune Pollack, MD  docusate sodium (COLACE) 100 MG capsule Take 100 mg by mouth daily.    Historical Provider, MD  folic acid (FOLVITE) 1 MG tablet Take 1 mg by mouth daily.    Historical Provider, MD  furosemide (LASIX) 20 MG tablet Take 20 mg by mouth daily.    Historical Provider, MD  haloperidol (HALDOL) 0.5 MG tablet Take 1 tablet (0.5 mg total) by mouth every 4 (four) hours as needed for agitation. 07/21/15   Shaune Pollack, MD    losartan-hydrochlorothiazide (HYZAAR) 50-12.5 MG per tablet Take 0.5 tablets by mouth daily.     Historical Provider, MD  methotrexate (RHEUMATREX) 2.5 MG tablet Take 10 mg by mouth once a week. Caution:Chemotherapy. Protect from light.    Historical Provider, MD  Multiple Vitamin (DAILY VITE) TABS Take 1 tablet by mouth daily.    Historical Provider, MD  nystatin (NYSTATIN) powder Apply topically See admin instructions. Apply to areas that are inflammed and look yeast-like topically 2 times a day. Apply until rash resolved. Use it as needed, thereafter.    Historical Provider, MD  ondansetron (ZOFRAN) 4 MG tablet Take 1 tablet (4 mg total) by mouth daily as needed for nausea or vomiting. 06/13/15   Emily Filbert, MD  Polyethyl Glycol-Propyl Glycol (SYSTANE ULTRA) 0.4-0.3 % SOLN Apply 1 drop to eye 3 (three) times daily.    Historical Provider, MD  pregabalin (LYRICA) 75 MG capsule Take 75 mg by mouth daily.     Historical Provider, MD  QUEtiapine (SEROQUEL) 50 MG tablet Take 100 mg by mouth at bedtime.    Historical Provider, MD  ranitidine (ZANTAC) 150 MG tablet Take 150 mg by mouth at bedtime.    Historical Provider, MD  sertraline (ZOLOFT) 100 MG tablet Take 100 mg by mouth every morning.     Historical Provider, MD    Allergies Patient has no known allergies.  History reviewed. No pertinent family history.  Social  History Social History  Substance Use Topics  . Smoking status: Former Smoker    Years: 45.00  . Smokeless tobacco: Not on file  . Alcohol use No    Review of Systems Constitutional: fever Eyes: No visual changes. ENT: No sore throat. Cardiovascular: Denies chest pain. Respiratory:cough Gastrointestinal: No abdominal pain.  No nausea, no vomiting.  No diarrhea.  No constipation. Genitourinary: Negative for dysuria. Musculoskeletal: Negative for back pain. Skin: Negative for rash. Neurological: Negative for headaches, focal weakness or numbness.  10-point ROS  otherwise negative. However, review of systems is confounded by patient's advanced dementia at her baseline.  ____________________________________________   PHYSICAL EXAM:  VITAL SIGNS: ED Triage Vitals  Enc Vitals Group     BP 05/13/16 1427 (!) 162/80     Pulse Rate 05/13/16 1427 96     Resp 05/13/16 1427 18     Temp 05/13/16 1427 100.2 F (37.9 C)     Temp Source 05/13/16 1427 Oral     SpO2 05/13/16 1427 96 %     Weight 05/13/16 1428 180 lb (81.6 kg)     Height --      Head Circumference --      Peak Flow --      Pain Score --      Pain Loc --      Pain Edu? --      Excl. in GC? --     Constitutional: Alert  and in no acute distress. Eyes: Conjunctivae are normal. PERRL. EOMI. Head: Atraumatic. Nose: No congestion/rhinnorhea. Mouth/Throat: Mucous membranes are moist.   Neck: No stridor.   Cardiovascular: Tachycardic, regular rhythm. Grossly normal heart sounds.   Respiratory: Normal respiratory effort.  No retractions. Lungs CTAB. Gastrointestinal: Soft and nontender. No distention.  Musculoskeletal: No lower extremity tenderness.  Right lower extremity with moderate edema compared to just mild edema on the left lower 70. The right lower extremity is also warmer with erythema anteriorly and medially without any induration or pus.   Neurologic:  Normal speech and language. No gross focal neurologic deficits are appreciated.  Skin:  Skin is warm, dry and intact. No rash noted. Psychiatric: Mood and affect are normal. Speech and behavior are normal.  ____________________________________________   LABS (all labs ordered are listed, but only abnormal results are displayed)  Labs Reviewed  COMPREHENSIVE METABOLIC PANEL - Abnormal; Notable for the following:       Result Value   Potassium 3.0 (*)    Glucose, Bld 125 (*)    All other components within normal limits  CBC WITH DIFFERENTIAL/PLATELET - Abnormal; Notable for the following:    WBC 11.4 (*)    RDW 14.6 (*)      Neutro Abs 9.5 (*)    Lymphs Abs 0.9 (*)    All other components within normal limits  URINALYSIS, COMPLETE (UACMP) WITH MICROSCOPIC - Abnormal; Notable for the following:    Color, Urine AMBER (*)    APPearance CLOUDY (*)    Protein, ur 30 (*)    Nitrite POSITIVE (*)    Leukocytes, UA MODERATE (*)    Bacteria, UA FEW (*)    Squamous Epithelial / LPF 0-5 (*)    All other components within normal limits  CULTURE, BLOOD (ROUTINE X 2)  CULTURE, BLOOD (ROUTINE X 2)  URINE CULTURE  LACTIC ACID, PLASMA  INFLUENZA PANEL BY PCR (TYPE A & B)  LACTIC ACID, PLASMA   ____________________________________________  EKG  ED ECG REPORT I, Schaevitz,  Teena Irani, the attending physician, personally viewed and interpreted this ECG.   Date: 05/13/2016  EKG Time: 1608  Rate: 104  Rhythm: sinus tachycardia  Axis: Normal axis  Intervals:none  ST&T Change: No ST segment elevation or depression. No abnormal T-wave inversion.  ____________________________________________  RADIOLOGY    DG Chest 2 View (Final result)  Result time 05/13/16 15:52:20  Final result by Francene Boyers, MD (05/13/16 15:52:20)           Narrative:   CLINICAL DATA: Productive cough and fever.  EXAM: CHEST 2 VIEW  COMPARISON: 07/18/2015  FINDINGS: There is peribronchial thickening. Heart size and pulmonary vascularity are normal. No consolidative infiltrates or effusions. No significant bone abnormality.  Calcification in the thoracic aorta. Diffuse osteopenia.  IMPRESSION: Slight bronchitic changes.  Aortic atherosclerosis.   Electronically Signed By: Francene Boyers M.D. On: 05/13/2016 15:52          ____________________________________________   PROCEDURES  Procedure(s) performed:   Procedures  Critical Care performed:   ____________________________________________   INITIAL IMPRESSION / ASSESSMENT AND PLAN / ED COURSE  Pertinent labs & imaging results that were  available during my care of the patient were reviewed by me and considered in my medical decision making (see chart for details).  Suspect pneumonia versus UTI versus cellulitis versus flu. Pending test results at this time.    ----------------------------------------- 5:58 PM on 05/13/2016 -----------------------------------------  Patient meeting sepsis criteria and appears to have a urinary tract infection. Sepsis alert called. Because the patient is in a skilled nursing facility she will be treated with the healthcare associated antibiotic protocol. I explain the diagnosis to the son who is at the bedside as well as the need for admission. Signed out to Dr. Allena Katz of the hospitalist service.  ____________________________________________   FINAL CLINICAL IMPRESSION(S) / ED DIAGNOSES  Sepsis. UTI. Weakness.    NEW MEDICATIONS STARTED DURING THIS VISIT:  New Prescriptions   No medications on file     Note:  This document was prepared using Dragon voice recognition software and may include unintentional dictation errors.    Myrna Blazer, MD 05/13/16 Windy Fast

## 2016-05-13 NOTE — ED Notes (Signed)
Gave patient busy apron.

## 2016-05-13 NOTE — ED Triage Notes (Signed)
Patient from [facility] on [address]. Patient presents with nasal congestion because "someone left the window open" per staff. Patient is A+O to baseline

## 2016-05-13 NOTE — ED Notes (Signed)
Gave patient a bolus of NS per EDP.

## 2016-05-13 NOTE — H&P (Signed)
[facility] Physicians - [place] at [facility]   PATIENT NAME: Kim Morgan    MR#:  [id number]  DATE OF BIRTH:  04/01/1945  DATE OF ADMISSION:  05/13/2016  PRIMARY CARE PHYSICIAN  : Pcp Not In System  REQUESTING/REFERRING PHYSICIAN: Dr. Langston Masker  CHIEF COMPLAINT: Confusion    Chief Complaint  Patient presents with  . Nasal Congestion    HISTORY OF PRESENT ILLNESS:  Kim Morgan  is a 71 y.o. female with a known history of Dementia, lives at  [place] assisted living facility brought in a cause of worsening confusion, fever, cough. Found to have UTI. Patient at baseline needs help with feeding,  but . No nausea, vomiting, diarrhea.  Marland Kitchen  PAST MEDICAL HISTORY:   Past Medical History:  Diagnosis Date  . Dementia   . Depression   . Hypertension   . Osteoarthritis     PAST SURGICAL HISTOIRY:  History reviewed. No pertinent surgical history.  SOCIAL HISTORY:   Social History  Substance Use Topics  . Smoking status: Former Smoker    Years: 45.00  . Smokeless tobacco: Not on file  . Alcohol use No    FAMILY HISTORY:  History reviewed. No pertinent family history.  DRUG ALLERGIES:  No Known Allergies  REVIEW OF SYSTEMS:   unable  obtain review of systems because of dementia.   MEDICATIONS AT HOME:   Prior to Admission medications   Medication Sig Start Date End Date Taking? Authorizing Provider  docusate sodium (COLACE) 100 MG capsule Take 100 mg by mouth daily.   Yes Historical Provider, MD  folic acid (FOLVITE) 1 MG tablet Take 1 mg by mouth daily.   Yes Historical Provider, MD  hydrochlorothiazide (MICROZIDE) 12.5 MG capsule Take 12.5 mg by mouth daily.   Yes Historical Provider, MD  HYDROcodone-acetaminophen (NORCO/VICODIN) 5-325 MG tablet Take 1 tablet by mouth 2 (two) times daily.   Yes Historical Provider, MD  Melatonin 5 MG TABS Take 10 mg by mouth at bedtime.   Yes Historical Provider, MD  methotrexate (RHEUMATREX) 2.5 MG tablet Take 10  mg by mouth once a week. Caution:Chemotherapy. Protect from light.   Yes Historical Provider, MD  Multiple Vitamin (DAILY VITE) TABS Take 1 tablet by mouth daily.   Yes Historical Provider, MD  nystatin (NYSTATIN) powder Apply topically See admin instructions. Apply to areas that are inflammed and look yeast-like topically 2 times a day. Apply until rash resolved. Use it as needed, thereafter.   Yes Historical Provider, MD  ondansetron (ZOFRAN) 4 MG tablet Take 1 tablet (4 mg total) by mouth daily as needed for nausea or vomiting. 06/13/15  Yes Emily Filbert, MD  Polyethyl Glycol-Propyl Glycol (SYSTANE ULTRA) 0.4-0.3 % SOLN Apply 1 drop to eye 3 (three) times daily.   Yes Historical Provider, MD  ranitidine (ZANTAC) 150 MG tablet Take 150 mg by mouth at bedtime.   Yes Historical Provider, MD  sertraline (ZOLOFT) 100 MG tablet Take 100 mg by mouth every morning.    Yes Historical Provider, MD  atorvastatin (LIPITOR) 20 MG tablet Take 20 mg by mouth daily.    Historical Provider, MD  losartan-hydrochlorothiazide (HYZAAR) 50-12.5 MG per tablet Take 0.5 tablets by mouth daily.     Historical Provider, MD      VITAL SIGNS:  Blood pressure (!) 99/55, pulse 98, temperature 99 F (37.2 C), temperature source Axillary, resp. rate 14, height 5\' 6"  (1.676 m), weight 81.6 kg (180 lb), SpO2 95 %.  PHYSICAL EXAMINATION:  GENERAL:  71 y.o.-year-old patient lying in the bed with no acute distress.  EYES: Pupils equal, round, reactive to light a. No scleral icterus. Extraocular muscles intact.  HEENT: Head atraumatic, normocephalic. Oropharynx and nasopharynx clear.  NECK:  Supple, no jugular venous distention. No thyroid enlargement, no tenderness.  LUNGS: Normal breath sounds bilaterally, no wheezing, rales,rhonchi or crepitation. No use of accessory muscles of respiration.  CARDIOVASCULAR: S1, S2 normal. No murmurs, rubs, or gallops.  ABDOMEN: Soft, nontender, nondistended. Bowel sounds present. No  organomegaly or mass.  EXTREMITIES: No pedal edema, cyanosis, or clubbing.  NEUROLOGIC: Patient has severe dementia, not able to follow commands  to  Do full neurological exam.  PSych; confused.  SKIN: No obvious rash, lesion, or ulcer.   LABORATORY PANEL:   CBC  Recent Labs Lab 05/13/16 1556  WBC 11.4*  HGB 12.5  HCT 37.1  PLT 232   ------------------------------------------------------------------------------------------------------------------  Chemistries   Recent Labs Lab 05/13/16 1556  NA 136  K 3.0*  CL 103  CO2 23  GLUCOSE 125*  BUN 15  CREATININE 0.59  CALCIUM 9.0  AST 24  ALT 16  ALKPHOS 63  BILITOT 0.9   ------------------------------------------------------------------------------------------------------------------  Cardiac Enzymes No results for input(s): TROPONINI in the last 168 hours. ------------------------------------------------------------------------------------------------------------------  RADIOLOGY:  Dg Chest 2 View  Result Date: 05/13/2016 CLINICAL DATA:  Productive cough and fever. EXAM: CHEST  2 VIEW COMPARISON:  07/18/2015 FINDINGS: There is peribronchial thickening. Heart size and pulmonary vascularity are normal. No consolidative infiltrates or effusions. No significant bone abnormality. Calcification in the thoracic aorta.  Diffuse osteopenia. IMPRESSION: Slight bronchitic changes. Aortic atherosclerosis. Electronically Signed   By: Francene Boyers M.D.   On: 05/13/2016 15:52    EKG:   Orders placed or performed during the hospital encounter of 05/13/16  . ED EKG 12-Lead  . ED EKG 12-Lead    IMPRESSION AND PLAN:   Sepsis secondary to UTI: Admitted to hospitalist service, continue IV fluids, IV antibiotics, follow blood cultures, urine cultures. Lactic acid level is 0.9.  #2 hypotension: Due to UTI: Hold the BP medicines. #3 hypokalemia: Due to diuretics: Hold losartan/STZ. And hold Lasix. Continue potassium  replacements. #4 history of rheumatoid arthritis: Patient is on methotrexate 10 mg once a week. #5 severe baseline dementia; patient is from assisted living facility. Patient's husband does not want any sedatives during her hospital stay. Continue her home medicine aloft 100 MG daily. Please do not use any Ativan or Haldol without husband's permission.   All the records are reviewed and case discussed with ED provider. Management plans discussed with the patient, family and they are in agreement.  CODE STATUS: full  TOTAL TIME TAKING CARE OF THIS PATIENT: 55 minutes.    Katha Hamming M.D on 05/13/2016 at 6:48 PM  Between 7am to 6pm - Pager - [phone]  After 6pm go to [email or web address] - password [email or web address]  [facility]  Office  [phone]  CC: Primary care physician; Pcp Not In System  Note: This dictation was prepared with Dragon dictation along with smaller phrase technology. Any transcriptional errors that result from this process are unintentional.

## 2016-05-14 ENCOUNTER — Encounter: Payer: Self-pay | Admitting: *Deleted

## 2016-05-14 LAB — BASIC METABOLIC PANEL
ANION GAP: 4 — AB (ref 5–15)
BUN: 13 mg/dL (ref 6–20)
CO2: 23 mmol/L (ref 22–32)
Calcium: 7.9 mg/dL — ABNORMAL LOW (ref 8.9–10.3)
Chloride: 112 mmol/L — ABNORMAL HIGH (ref 101–111)
Creatinine, Ser: 0.7 mg/dL (ref 0.44–1.00)
GFR calc non Af Amer: >60 mL/min (ref >60)
Glucose, Bld: 111 mg/dL — ABNORMAL HIGH (ref 65–99)
POTASSIUM: 3.2 mmol/L — AB (ref 3.5–5.1)
SODIUM: 139 mmol/L (ref 135–145)

## 2016-05-14 LAB — CBC
HEMATOCRIT: 33 % — AB (ref 35.0–47.0)
HEMOGLOBIN: 11.3 g/dL — AB (ref 12.0–16.0)
MCH: 30.5 pg (ref 26.0–34.0)
MCHC: 34.2 g/dL (ref 32.0–36.0)
MCV: 89.1 fL (ref 80.0–100.0)
Platelets: 180 10*3/uL (ref 150–440)
RBC: 3.71 MIL/uL — AB (ref 3.80–5.20)
RDW: 14.8 % — ABNORMAL HIGH (ref 11.5–14.5)
WBC: 8.8 10*3/uL (ref 3.6–11.0)

## 2016-05-14 LAB — GLUCOSE, CAPILLARY: Glucose-Capillary: 99 mg/dL (ref 65–99)

## 2016-05-14 LAB — MRSA PCR SCREENING: MRSA by PCR: NEGATIVE

## 2016-05-14 MED ORDER — GUAIFENESIN-DM 100-10 MG/5ML PO SYRP
5.0000 mL | ORAL_SOLUTION | ORAL | Status: DC | PRN
  Administered 2016-05-15: 5 mL via ORAL
  Filled 2016-05-14: qty 5

## 2016-05-14 MED ORDER — POTASSIUM CHLORIDE 20 MEQ PO PACK
40.0000 meq | PACK | Freq: Once | ORAL | Status: AC
  Administered 2016-05-14: 40 meq via ORAL
  Filled 2016-05-14: qty 2

## 2016-05-14 NOTE — Clinical Social Work Note (Signed)
Clinical Social Work Assessment  Patient Details  Name: Kim Morgan MRN: [id number] Date of Birth: February 16, 1945  Date of referral:  05/14/16               Reason for consult:  Facility Placement                Permission sought to share information with:  Facility Art therapist granted to share information::  Yes, Verbal Permission Granted  Name::        Agency::     Relationship::     Contact Information:     Housing/Transportation Living arrangements for the past 2 months:  [place] of Information:  Patient, Spouse, Facility Patient Interpreter Needed:  None Criminal Activity/Legal Involvement Pertinent to Current Situation/Hospitalization:  No - Comment as needed Significant Relationships:  Warehouse manager, Spouse Lives with:  Facility Resident Do you feel safe going back to the place where you live?  Yes Need for family participation in patient care:  Yes (Comment)  Care giving concerns:  Patient admitted from ALF   Social Worker assessment / plan:  CSW met with patient and her husband at bedside to discuss dc planning. The patient was jovial and laughing but confused. The patient's husband supplied information.  At baseline, the patient requires assistance with ambulation, and she is incontinent of both bladder and bowel. The patient resides at [facility], and according to the facility, she can return when stable via EMS due to ambulation and dementia concerns.   Employment status:  Retired Forensic scientist:  Medicare PT Recommendations:  Not assessed at this time Information / Referral to community resources:     Patient/Family's Response to care:  Patient's husband and facility representative thanked CSW for assistance.  Patient/Family's Understanding of and Emotional Response to Diagnosis, Current Treatment, and Prognosis:  Patient's family is in agreement with dc plan.  Emotional Assessment Appearance:  Appears  stated age Attitude/Demeanor/Rapport:   (Jovial and pleasantly confused) Affect (typically observed):  Appropriate, Pleasant Orientation:  Oriented to Self Alcohol / Substance use:  Never Used Psych involvement (Current and /or in the community):  No (Comment)  Discharge Needs  Concerns to be addressed:  Care Coordination, Discharge Planning Concerns Readmission within the last 30 days:  No Current discharge risk:  None Barriers to Discharge:  No Barriers Identified   Zettie Pho, LCSW 05/14/2016, 2:48 PM

## 2016-05-14 NOTE — Clinical Social Work Note (Signed)
CSW following as patient from [facility]. CSW will assess when able.  Argentina Ponder, MSW, Theresia Majors [phone]

## 2016-05-14 NOTE — Progress Notes (Signed)
[facility] - [place] at [facility]   PATIENT NAME: Kim Morgan    MR#:  [id number]  DATE OF BIRTH:  11/10/1945  SUBJECTIVE:  CHIEF COMPLAINT:   Chief Complaint  Patient presents with  . Nasal Congestion   Patient is awake and alert. Pleasantly confused. Husband at bedside and gives all of her history. Bedbound at baseline. Takes a few steps with help.  REVIEW OF SYSTEMS:    Review of Systems  Unable to perform ROS: Dementia    DRUG ALLERGIES:  No Known Allergies  VITALS:  Blood pressure (!) 119/52, pulse 73, temperature 98.1 F (36.7 C), temperature source Oral, resp. rate 18, height 5\' 6"  (1.676 m), weight 67.3 kg (148 lb 4.8 oz), SpO2 98 %.  PHYSICAL EXAMINATION:   Physical Exam  GENERAL:  71 y.o.-year-old patient lying in the bed with no acute distress.  EYES: Pupils equal, round, reactive to light and accommodation. No scleral icterus. Extraocular muscles intact.  HEENT: Head atraumatic, normocephalic. Oropharynx and nasopharynx clear.  NECK:  Supple, no jugular venous distention. No thyroid enlargement, no tenderness.  LUNGS: Normal breath sounds bilaterally, no wheezing, rales, rhonchi. No use of accessory muscles of respiration.  CARDIOVASCULAR: S1, S2 normal. No murmurs, rubs, or gallops.  ABDOMEN: Soft, nontender, nondistended. Bowel sounds present. No organomegaly or mass.  EXTREMITIES: No cyanosis, clubbing or edema b/l.    NEUROLOGIC: Cranial nerves II through XII are intact. No focal Motor or sensory deficits b/l.   PSYCHIATRIC: The patient is alert and oriented x 3.  SKIN: No obvious rash, lesion, or ulcer.   LABORATORY PANEL:   CBC  Recent Labs Lab 05/14/16 0356  WBC 8.8  HGB 11.3*  HCT 33.0*  PLT 180   ------------------------------------------------------------------------------------------------------------------ Chemistries   Recent Labs Lab 05/13/16 1556 05/14/16 0356  NA 136 139  K 3.0* 3.2*  CL 103 112*  CO2 23  23  GLUCOSE 125* 111*  BUN 15 13  CREATININE 0.59 0.70  CALCIUM 9.0 7.9*  AST 24  --   ALT 16  --   ALKPHOS 63  --   BILITOT 0.9  --    ------------------------------------------------------------------------------------------------------------------  Cardiac Enzymes No results for input(s): TROPONINI in the last 168 hours. --------------------------------------------------------------------------------------------------------------- RADIOLOGY:  Dg Chest 2 View  Result Date: 05/13/2016 CLINICAL DATA:  Productive cough and fever. EXAM: CHEST  2 VIEW COMPARISON:  07/18/2015 FINDINGS: There is peribronchial thickening. Heart size and pulmonary vascularity are normal. No consolidative infiltrates or effusions. No significant bone abnormality. Calcification in the thoracic aorta.  Diffuse osteopenia. IMPRESSION: Slight bronchitic changes. Aortic atherosclerosis. Electronically Signed   By: 09/17/2015 M.D.   On: 05/13/2016 15:52   ASSESSMENT AND PLAN:   * UTI with sepsis present on admission On IV ceftriaxone. Urine cultures pending.  * Acute encephalopathy over baseline dementia. Improving. Husband feels patient is getting close to normal.  * Hypertension. Patient was hypotensive at admission. Medications on hold.  * DVT prophylaxis with Lovenox  All the records are reviewed and case discussed with Care Management/Social Worker Management plans discussed with the patient, family and they are in agreement.  CODE STATUS: FULL CODE  DVT Prophylaxis: SCDs  TOTAL TIME TAKING CARE OF THIS PATIENT: 25 minutes.   Likely discharge tomorrow  07/13/2016 R M.D on 05/14/2016 at 1:01 PM  Between 7am to 6pm - Pager - [phone]  After 6pm go to [email or web address] - password [email or web address] [facility]  [facility] [place] [facility]  Office  [phone]  CC:  Primary care physician; Pcp Not In System  Note: This dictation was prepared with Dragon dictation along with smaller phrase technology. Any  transcriptional errors that result from this process are unintentional.

## 2016-05-14 NOTE — NC FL2 (Signed)
[place] MEDICAID FL2 LEVEL OF CARE SCREENING TOOL     IDENTIFICATION  Patient Name: Kim Morgan Birthdate: 18-Jan-1946 Sex: female Admission Date (Current Location): 05/13/2016  [place] and [place] Number:  Chiropodist and Address:  [facility], [address] [place], [place] [address]      Provider Number: [id number]  Attending Physician Name and Address:  Milagros Loll, MD  Relative Name and Phone Number:       Current Level of Care: Hospital Recommended Level of Care: Assisted Living Facility Prior Approval Number:    Date Approved/Denied:   PASRR Number: [id number]  Discharge Plan: Domiciliary (Rest home)    Current Diagnoses: Alzheimers/Dementia Patient Active Problem List   Diagnosis Date Noted  . Acute encephalopathy   . Hypothermia 07/18/2015  . Sepsis (HCC) 07/18/2015    Orientation RESPIRATION BLADDER Height & Weight     Self  Normal Continent Weight: 148 lb 4.8 oz (67.3 kg) Height:  5\' 6"  (167.6 cm)  BEHAVIORAL SYMPTOMS/MOOD NEUROLOGICAL BOWEL NUTRITION STATUS      Continent    AMBULATORY STATUS COMMUNICATION OF NEEDS Skin   Total Care Verbally Skin abrasions                       Personal Care Assistance Level of Assistance  Bathing, Feeding, Dressing Bathing Assistance: Maximum assistance Feeding assistance: Independent Dressing Assistance: Maximum assistance     Functional Limitations Info             SPECIAL CARE FACTORS FREQUENCY   Home Health: PT/RN/OT                    Contractures      Additional Factors Info  Code Status Code Status Info: DNR             DISCHARGE MEDICATIONS:      Current Discharge Medication List       START taking these medications   Details  cephALEXin (KEFLEX) 500 MG capsule Take 1 capsule (500 mg total) by mouth 3 (three) times daily. Qty: 18 capsule, Refills: 0         CONTINUE these medications which have NOT CHANGED    Details  docusate sodium (COLACE) 100 MG capsule Take 100 mg by mouth daily.    folic acid (FOLVITE) 1 MG tablet Take 1 mg by mouth daily.    hydrochlorothiazide (MICROZIDE) 12.5 MG capsule Take 12.5 mg by mouth daily.    HYDROcodone-acetaminophen (NORCO/VICODIN) 5-325 MG tablet Take 1 tablet by mouth 2 (two) times daily.    Melatonin 5 MG TABS Take 10 mg by mouth at bedtime.    methotrexate (RHEUMATREX) 2.5 MG tablet Take 10 mg by mouth once a week. Caution:Chemotherapy. Protect from light.    Multiple Vitamin (DAILY VITE) TABS Take 1 tablet by mouth daily.    nystatin (NYSTATIN) powder Apply topically See admin instructions. Apply to areas that are inflammed and look yeast-like topically 2 times a day. Apply until rash resolved. Use it as needed, thereafter.    ondansetron (ZOFRAN) 4 MG tablet Take 1 tablet (4 mg total) by mouth daily as needed for nausea or vomiting. Qty: 20 tablet, Refills: 1    Polyethyl Glycol-Propyl Glycol (SYSTANE ULTRA) 0.4-0.3 % SOLN Apply 1 drop to eye 3 (three) times daily.    ranitidine (ZANTAC) 150 MG tablet Take 150 mg by mouth at bedtime.    sertraline (ZOLOFT) 100 MG tablet Take 100  mg by mouth every morning.     atorvastatin (LIPITOR) 20 MG tablet Take 20 mg by mouth daily.    losartan-hydrochlorothiazide (HYZAAR) 50-12.5 MG per tablet Take 0.5 tablets by mouth daily.           Additional Information SS#[id number]  Judi Cong, LCSW

## 2016-05-15 LAB — GLUCOSE, CAPILLARY: GLUCOSE-CAPILLARY: 108 mg/dL — AB (ref 65–99)

## 2016-05-15 MED ORDER — HYDROCHLOROTHIAZIDE 12.5 MG PO CAPS
12.5000 mg | ORAL_CAPSULE | Freq: Every day | ORAL | Status: DC
  Administered 2016-05-15 – 2016-05-16 (×2): 12.5 mg via ORAL
  Filled 2016-05-15 (×2): qty 1

## 2016-05-15 MED ORDER — LOSARTAN POTASSIUM 25 MG PO TABS
25.0000 mg | ORAL_TABLET | Freq: Every day | ORAL | Status: DC
  Administered 2016-05-15 – 2016-05-16 (×2): 25 mg via ORAL
  Filled 2016-05-15 (×2): qty 1

## 2016-05-15 NOTE — Plan of Care (Signed)
Problem: Education: Goal: Knowledge of [place] [facility] Education information/materials will improve Outcome: Not Progressing Patient's mental status prevents patient from demonstrating knowledge if general education information/materials.  Problem: Health Behavior/Discharge Planning: Goal: Ability to manage health-related needs will improve Outcome: Not Progressing Patient's mental status prevents patient from managing health related needs.

## 2016-05-15 NOTE — Progress Notes (Addendum)
[facility] - [place] at [facility]   PATIENT NAME: Kim Morgan    MR#:  [id number]  DATE OF BIRTH:  11-05-1945  SUBJECTIVE:  CHIEF COMPLAINT:   Chief Complaint  Patient presents with  . Nasal Congestion   Patient is awake and alert. Pleasantly confused.  REVIEW OF SYSTEMS:    Review of Systems  Unable to perform ROS: Dementia    DRUG ALLERGIES:  No Known Allergies  VITALS:  Blood pressure (!) 150/69, pulse 81, temperature 98.7 F (37.1 C), temperature source Oral, resp. rate 20, height 5\' 6"  (1.676 m), weight 68.5 kg (151 lb), SpO2 97 %.  PHYSICAL EXAMINATION:   Physical Exam  GENERAL:  71 y.o.-year-old patient lying in the bed with no acute distress.  EYES: Pupils equal, round, reactive to light and accommodation. No scleral icterus. Extraocular muscles intact.  HEENT: Head atraumatic, normocephalic. Oropharynx and nasopharynx clear.  NECK:  Supple, no jugular venous distention. No thyroid enlargement, no tenderness.  LUNGS: Normal breath sounds bilaterally, no wheezing, rales, rhonchi. No use of accessory muscles of respiration.  CARDIOVASCULAR: S1, S2 normal. No murmurs, rubs, or gallops.  ABDOMEN: Soft, nontender, nondistended. Bowel sounds present. No organomegaly or mass.  EXTREMITIES: No cyanosis, clubbing or edema b/l.    NEUROLOGIC: Cranial nerves II through XII are intact. No focal Motor or sensory deficits b/l.   PSYCHIATRIC: The patient is alert and oriented x 3.  SKIN: No obvious rash, lesion, or ulcer.   LABORATORY PANEL:   CBC  Recent Labs Lab 05/14/16 0356  WBC 8.8  HGB 11.3*  HCT 33.0*  PLT 180   ------------------------------------------------------------------------------------------------------------------ Chemistries   Recent Labs Lab 05/13/16 1556 05/14/16 0356  NA 136 139  K 3.0* 3.2*  CL 103 112*  CO2 23 23  GLUCOSE 125* 111*  BUN 15 13  CREATININE 0.59 0.70  CALCIUM 9.0 7.9*  AST 24  --   ALT 16  --    ALKPHOS 63  --   BILITOT 0.9  --    ------------------------------------------------------------------------------------------------------------------  Cardiac Enzymes No results for input(s): TROPONINI in the last 168 hours. --------------------------------------------------------------------------------------------------------------- RADIOLOGY:  Dg Chest 2 View  Result Date: 05/13/2016 CLINICAL DATA:  Productive cough and fever. EXAM: CHEST  2 VIEW COMPARISON:  07/18/2015 FINDINGS: There is peribronchial thickening. Heart size and pulmonary vascularity are normal. No consolidative infiltrates or effusions. No significant bone abnormality. Calcification in the thoracic aorta.  Diffuse osteopenia. IMPRESSION: Slight bronchitic changes. Aortic atherosclerosis. Electronically Signed   By: 09/17/2015 M.D.   On: 05/13/2016 15:52   ASSESSMENT AND PLAN:   * UTI with sepsis present on admission UCx with E coli. Sensitivities not available. No old cx available. On IV ceftriaxone. D/C in AM with PO abx  * Acute encephalopathy over baseline dementia. Improving.   * Hypertension. Patient was hypotensive at admission.  Now hypertensive Restart meds  * DVT prophylaxis with Lovenox  All the records are reviewed and case discussed with Care Management/Social Worker Management plans discussed with the patient, family and they are in agreement.  CODE STATUS: FULL CODE  DVT Prophylaxis: SCDs  TOTAL TIME TAKING CARE OF THIS PATIENT: 30 minutes.   Likely discharge tomorrow  07/13/2016 R M.D on 05/15/2016 at 1:07 PM  Between 7am to 6pm - Pager - [phone]  After 6pm go to [email or web address] - password [email or web address] [facility]  [facility] [place] [facility]  Office  [phone]  CC: Primary care physician; Pcp Not In System  Note: This dictation  was prepared with Dragon dictation along with smaller phrase technology. Any transcriptional errors that result from this process are unintentional.

## 2016-05-16 LAB — MAGNESIUM: Magnesium: 1.9 mg/dL (ref 1.7–2.4)

## 2016-05-16 LAB — POTASSIUM: Potassium: 3 mmol/L — ABNORMAL LOW (ref 3.5–5.1)

## 2016-05-16 MED ORDER — CEPHALEXIN 500 MG PO CAPS: 500.0000 mg | ORAL_CAPSULE | Freq: Three times a day (TID) | ORAL | 0 refills | Status: DC

## 2016-05-16 MED ORDER — POTASSIUM CHLORIDE CRYS ER 20 MEQ PO TBCR: 40.0000 meq | EXTENDED_RELEASE_TABLET | Freq: Once | ORAL | Status: DC

## 2016-05-16 MED ORDER — CEPHALEXIN 500 MG PO CAPS
500.0000 mg | ORAL_CAPSULE | Freq: Three times a day (TID) | ORAL | 0 refills | Status: AC
Start: 2016-05-16 — End: 2016-05-22

## 2016-05-16 NOTE — Care Management (Signed)
Patient to discharge to [facility] with home health services through [facility].  Elnita Maxwell with [employer] notified of referral and discharge

## 2016-05-16 NOTE — Discharge Summary (Addendum)
[facility] - [place] at [facility]   PATIENT NAME: Kim Morgan    MR#:  [id number]  DATE OF BIRTH:  [birth year]  DATE OF ADMISSION:  05/13/2016 ADMITTING PHYSICIAN: Katha Hamming, MD  DATE OF DISCHARGE: 05/16/2016  PRIMARY CARE PHYSICIAN: Leim Fabry, MD     ADMISSION DIAGNOSIS:  Weakness [R53.1] Sepsis, due to unspecified organism (HCC) [A41.9] Urinary tract infection without hematuria, site unspecified [N39.0]  DISCHARGE DIAGNOSIS:  Active Problems:   Sepsis (HCC)   SECONDARY DIAGNOSIS:   Past Medical History:  Diagnosis Date  . Dementia   . Depression   . Hypertension   . Osteoarthritis     HOSPITAL COURSE:   71 year old female with dementia who presents with sepsis due to urinary tract infection.  1. Sepsis present on admission due to urinary tract infection Urine culture showing Escherichia coli which is pansensitive. She will be discharged on by mouth Keflex  2. Acute encephalopathy on baseline dementia which is improved  3. Essential hypertension: Patient will resume outpatient medications which include HCTZ and Hyzaar  4. Hyperlipidemia: Continue Lipitor  5. Dementia/depression: Continue Zoloft 6. Hypokalemia: This is repleted prior to discharge.   DISCHARGE CONDITIONS AND DIET:  Stable Heart healthy diet  CONSULTS OBTAINED:    DRUG ALLERGIES:  No Known Allergies  DISCHARGE MEDICATIONS:   Current Discharge Medication List    START taking these medications   Details  cephALEXin (KEFLEX) 500 MG capsule Take 1 capsule (500 mg total) by mouth 3 (three) times daily. Qty: 18 capsule, Refills: 0      CONTINUE these medications which have NOT CHANGED   Details  docusate sodium (COLACE) 100 MG capsule Take 100 mg by mouth daily.    folic acid (FOLVITE) 1 MG tablet Take 1 mg by mouth daily.    hydrochlorothiazide (MICROZIDE) 12.5 MG capsule Take 12.5 mg by mouth daily.    HYDROcodone-acetaminophen (NORCO/VICODIN)  5-325 MG tablet Take 1 tablet by mouth 2 (two) times daily.    Melatonin 5 MG TABS Take 10 mg by mouth at bedtime.    methotrexate (RHEUMATREX) 2.5 MG tablet Take 10 mg by mouth once a week. Caution:Chemotherapy. Protect from light.    Multiple Vitamin (DAILY VITE) TABS Take 1 tablet by mouth daily.    nystatin (NYSTATIN) powder Apply topically See admin instructions. Apply to areas that are inflammed and look yeast-like topically 2 times a day. Apply until rash resolved. Use it as needed, thereafter.    ondansetron (ZOFRAN) 4 MG tablet Take 1 tablet (4 mg total) by mouth daily as needed for nausea or vomiting. Qty: 20 tablet, Refills: 1    Polyethyl Glycol-Propyl Glycol (SYSTANE ULTRA) 0.4-0.3 % SOLN Apply 1 drop to eye 3 (three) times daily.    ranitidine (ZANTAC) 150 MG tablet Take 150 mg by mouth at bedtime.    sertraline (ZOLOFT) 100 MG tablet Take 100 mg by mouth every morning.     atorvastatin (LIPITOR) 20 MG tablet Take 20 mg by mouth daily.    losartan-hydrochlorothiazide (HYZAAR) 50-12.5 MG per tablet Take 0.5 tablets by mouth daily.           Today   CHIEF COMPLAINT:  Doing ok patient confused husband at bedside   VITAL SIGNS:  Blood pressure (!) 119/95, pulse 82, temperature 98.6 F (37 C), temperature source Oral, resp. rate 17, height 5\' 6"  (1.676 m), weight 66.9 kg (147 lb 8 oz), SpO2 97 %.   REVIEW OF SYSTEMS:  Review of Systems  Unable to perform ROS: Dementia     PHYSICAL EXAMINATION:  GENERAL:  71 y.o.-year-old patient lying in the bed with no acute distress.  NECK:  Supple, no jugular venous distention. No thyroid enlargement, no tenderness.  LUNGS: Normal breath sounds bilaterally, no wheezing, rales,rhonchi  No use of accessory muscles of respiration.  CARDIOVASCULAR: S1, S2 normal. No murmurs, rubs, or gallops.  ABDOMEN: Soft, non-tender, non-distended. Bowel sounds present. No organomegaly or mass.  EXTREMITIES: No pedal edema, cyanosis, or  clubbing.  PSYCHIATRIC: The patient is alert and oriented x name .  SKIN: No obvious rash, lesion, or ulcer.   DATA REVIEW:   CBC  Recent Labs Lab 05/14/16 0356  WBC 8.8  HGB 11.3*  HCT 33.0*  PLT 180    Chemistries   Recent Labs Lab 05/13/16 1556 05/14/16 0356 05/16/16 0324  NA 136 139  --   K 3.0* 3.2* 3.0*  CL 103 112*  --   CO2 23 23  --   GLUCOSE 125* 111*  --   BUN 15 13  --   CREATININE 0.59 0.70  --   CALCIUM 9.0 7.9*  --   MG  --   --  1.9  AST 24  --   --   ALT 16  --   --   ALKPHOS 63  --   --   BILITOT 0.9  --   --     Cardiac Enzymes No results for input(s): TROPONINI in the last 168 hours.  Microbiology Results  @MICRORSLT48 @  RADIOLOGY:  No results found.    Current Discharge Medication List    START taking these medications   Details  cephALEXin (KEFLEX) 500 MG capsule Take 1 capsule (500 mg total) by mouth 3 (three) times daily. Qty: 18 capsule, Refills: 0      CONTINUE these medications which have NOT CHANGED   Details  docusate sodium (COLACE) 100 MG capsule Take 100 mg by mouth daily.    folic acid (FOLVITE) 1 MG tablet Take 1 mg by mouth daily.    hydrochlorothiazide (MICROZIDE) 12.5 MG capsule Take 12.5 mg by mouth daily.    HYDROcodone-acetaminophen (NORCO/VICODIN) 5-325 MG tablet Take 1 tablet by mouth 2 (two) times daily.    Melatonin 5 MG TABS Take 10 mg by mouth at bedtime.    methotrexate (RHEUMATREX) 2.5 MG tablet Take 10 mg by mouth once a week. Caution:Chemotherapy. Protect from light.    Multiple Vitamin (DAILY VITE) TABS Take 1 tablet by mouth daily.    nystatin (NYSTATIN) powder Apply topically See admin instructions. Apply to areas that are inflammed and look yeast-like topically 2 times a day. Apply until rash resolved. Use it as needed, thereafter.    ondansetron (ZOFRAN) 4 MG tablet Take 1 tablet (4 mg total) by mouth daily as needed for nausea or vomiting. Qty: 20 tablet, Refills: 1    Polyethyl  Glycol-Propyl Glycol (SYSTANE ULTRA) 0.4-0.3 % SOLN Apply 1 drop to eye 3 (three) times daily.    ranitidine (ZANTAC) 150 MG tablet Take 150 mg by mouth at bedtime.    sertraline (ZOLOFT) 100 MG tablet Take 100 mg by mouth every morning.     atorvastatin (LIPITOR) 20 MG tablet Take 20 mg by mouth daily.    losartan-hydrochlorothiazide (HYZAAR) 50-12.5 MG per tablet Take 0.5 tablets by mouth daily.            Management plans discussed with the patient and husband and they are in agreement. Stable  for discharge   Patient should follow up with pcp  CODE STATUS:     Code Status Orders        Start     Ordered   05/14/16 1118  Do not attempt resuscitation (DNR)  Continuous    Question Answer Comment  In the event of cardiac or respiratory ARREST Do not call a "code blue"   In the event of cardiac or respiratory ARREST Do not perform Intubation, CPR, defibrillation or ACLS   In the event of cardiac or respiratory ARREST Use medication by any route, position, wound care, and other measures to relive pain and suffering. May use oxygen, suction and manual treatment of airway obstruction as needed for comfort.      05/14/16 1117    Code Status History    Date Active Date Inactive Code Status Order ID Comments User Context   05/13/2016  6:47 PM 05/14/2016 11:17 AM Full Code [id number]  Katha Hamming, MD ED   07/18/2015  7:10 AM 07/21/2015  2:40 PM Full Code [id number]  Ihor Austin, MD ED      TOTAL TIME TAKING CARE OF THIS PATIENT: 37 minutes.    Note: This dictation was prepared with Dragon dictation along with smaller phrase technology. Any transcriptional errors that result from this process are unintentional.  Nicklos Gaxiola M.D on 05/16/2016 at 12:19 PM  Between 7am to 6pm - Pager - [phone] After 6pm go to [email or web address] - Social research officer, government  [facility]  Office  [phone]  CC: Primary care physician; Leim Fabry MD

## 2016-05-16 NOTE — Progress Notes (Signed)
Patient discharged to facility. IV site removed.Concerns addressed. Discharge summary reviewed with husband. Report called to caregiver at [facility].

## 2016-05-16 NOTE — Care Management Important Message (Signed)
Important Message  Patient Details  Name: FEY COGHILL MRN: [id number] Date of Birth: 1945/07/02   Medicare Important Message Given:  Yes    Chapman Fitch, RN 05/16/2016, 12:19 PM

## 2016-05-17 LAB — URINE CULTURE: Culture: >=100000 — AB

## 2016-05-18 LAB — CULTURE, BLOOD (ROUTINE X 2)
Culture: NO GROWTH
Culture: NO GROWTH
SPECIAL REQUESTS: ADEQUATE

## 2016-11-01 ENCOUNTER — Emergency Department: Payer: Medicare Other

## 2016-11-01 ENCOUNTER — Encounter
Admission: EM | Disposition: A | Discharge status: Skilled Nursing Facility | Payer: Self-pay | Source: Home / Self Care | Attending: Internal Medicine

## 2016-11-01 ENCOUNTER — Inpatient Hospital Stay: Payer: Medicare Other | Admitting: Anesthesiology

## 2016-11-01 ENCOUNTER — Inpatient Hospital Stay: Payer: Medicare Other

## 2016-11-01 ENCOUNTER — Inpatient Hospital Stay
Admission: EM | Admit: 2016-11-01 | Discharge: 2016-11-04 | DRG: 482 | Disposition: A | Discharge status: Skilled Nursing Facility | Payer: Medicare Other | Attending: Internal Medicine | Admitting: Internal Medicine

## 2016-11-01 ENCOUNTER — Encounter: Payer: Self-pay | Admitting: Emergency Medicine

## 2016-11-01 DIAGNOSIS — S72002A Fracture of unspecified part of neck of left femur, initial encounter for closed fracture: Secondary | ICD-10-CM

## 2016-11-01 DIAGNOSIS — S72142A Displaced intertrochanteric fracture of left femur, initial encounter for closed fracture: Secondary | ICD-10-CM | POA: Diagnosis present

## 2016-11-01 DIAGNOSIS — F329 Major depressive disorder, single episode, unspecified: Secondary | ICD-10-CM | POA: Diagnosis present

## 2016-11-01 DIAGNOSIS — G40909 Epilepsy, unspecified, not intractable, without status epilepticus: Secondary | ICD-10-CM | POA: Diagnosis present

## 2016-11-01 DIAGNOSIS — Z23 Encounter for immunization: Secondary | ICD-10-CM

## 2016-11-01 DIAGNOSIS — Y92099 Unspecified place in other non-institutional residence as the place of occurrence of the external cause: Secondary | ICD-10-CM | POA: Diagnosis not present

## 2016-11-01 DIAGNOSIS — M1990 Unspecified osteoarthritis, unspecified site: Secondary | ICD-10-CM | POA: Diagnosis present

## 2016-11-01 DIAGNOSIS — M25552 Pain in left hip: Secondary | ICD-10-CM | POA: Diagnosis present

## 2016-11-01 DIAGNOSIS — S51012A Laceration without foreign body of left elbow, initial encounter: Secondary | ICD-10-CM

## 2016-11-01 DIAGNOSIS — I10 Essential (primary) hypertension: Secondary | ICD-10-CM | POA: Diagnosis present

## 2016-11-01 DIAGNOSIS — F0390 Unspecified dementia without behavioral disturbance: Secondary | ICD-10-CM | POA: Diagnosis present

## 2016-11-01 DIAGNOSIS — Z419 Encounter for procedure for purposes other than remedying health state, unspecified: Secondary | ICD-10-CM

## 2016-11-01 DIAGNOSIS — Z79899 Other long term (current) drug therapy: Secondary | ICD-10-CM | POA: Diagnosis not present

## 2016-11-01 DIAGNOSIS — N3090 Cystitis, unspecified without hematuria: Secondary | ICD-10-CM | POA: Diagnosis present

## 2016-11-01 DIAGNOSIS — Z87891 Personal history of nicotine dependence: Secondary | ICD-10-CM | POA: Diagnosis not present

## 2016-11-01 DIAGNOSIS — Z66 Do not resuscitate: Secondary | ICD-10-CM | POA: Diagnosis present

## 2016-11-01 DIAGNOSIS — S72009A Fracture of unspecified part of neck of unspecified femur, initial encounter for closed fracture: Secondary | ICD-10-CM | POA: Diagnosis present

## 2016-11-01 DIAGNOSIS — M359 Systemic involvement of connective tissue, unspecified: Secondary | ICD-10-CM | POA: Diagnosis present

## 2016-11-01 DIAGNOSIS — E876 Hypokalemia: Secondary | ICD-10-CM | POA: Diagnosis present

## 2016-11-01 DIAGNOSIS — W19XXXA Unspecified fall, initial encounter: Secondary | ICD-10-CM

## 2016-11-01 DIAGNOSIS — W1830XA Fall on same level, unspecified, initial encounter: Secondary | ICD-10-CM | POA: Diagnosis not present

## 2016-11-01 HISTORY — PX: INTRAMEDULLARY (IM) NAIL INTERTROCHANTERIC: SHX5875

## 2016-11-01 LAB — CBC
HEMATOCRIT: 37.7 % (ref 35.0–47.0)
HEMOGLOBIN: 12.9 g/dL (ref 12.0–16.0)
MCH: 30.3 pg (ref 26.0–34.0)
MCHC: 34.3 g/dL (ref 32.0–36.0)
MCV: 88.2 fL (ref 80.0–100.0)
Platelets: 250 10*3/uL (ref 150–440)
RBC: 4.27 MIL/uL (ref 3.80–5.20)
RDW: 14.9 % — AB (ref 11.5–14.5)
WBC: 10.7 10*3/uL (ref 3.6–11.0)

## 2016-11-01 LAB — URINALYSIS, COMPLETE (UACMP) WITH MICROSCOPIC
Bilirubin Urine: NEGATIVE
GLUCOSE, UA: NEGATIVE mg/dL
HGB URINE DIPSTICK: NEGATIVE
KETONES UR: NEGATIVE mg/dL
NITRITE: NEGATIVE
PROTEIN: NEGATIVE mg/dL
Specific Gravity, Urine: 1.016 (ref 1.005–1.030)
pH: 5 (ref 5.0–8.0)

## 2016-11-01 LAB — BASIC METABOLIC PANEL
Anion gap: 10 (ref 5–15)
BUN: 19 mg/dL (ref 6–20)
CALCIUM: 9.3 mg/dL (ref 8.9–10.3)
CHLORIDE: 107 mmol/L (ref 101–111)
CO2: 25 mmol/L (ref 22–32)
CREATININE: 1.02 mg/dL — AB (ref 0.44–1.00)
GFR calc Af Amer: >60 mL/min (ref >60)
GFR calc non Af Amer: 55 mL/min — ABNORMAL LOW (ref >60)
GLUCOSE: 112 mg/dL — AB (ref 65–99)
Potassium: 3.4 mmol/L — ABNORMAL LOW (ref 3.5–5.1)
Sodium: 142 mmol/L (ref 135–145)

## 2016-11-01 LAB — HEMOGLOBIN A1C
Hgb A1c MFr Bld: 5.2 % (ref 4.8–5.6)
Mean Plasma Glucose: 102.54 mg/dL

## 2016-11-01 LAB — TSH: TSH: 3.487 u[IU]/mL (ref 0.350–4.500)

## 2016-11-01 LAB — TROPONIN I

## 2016-11-01 LAB — MRSA PCR SCREENING: MRSA by PCR: NEGATIVE

## 2016-11-01 SURGERY — FIXATION, FRACTURE, INTERTROCHANTERIC, WITH INTRAMEDULLARY ROD
Anesthesia: General | Site: Hip | Laterality: Left | Wound class: Clean

## 2016-11-01 MED ORDER — CEFAZOLIN SODIUM 1 G IJ SOLR
INTRAMUSCULAR | Status: AC
  Filled 2016-11-01: qty 10

## 2016-11-01 MED ORDER — LEVETIRACETAM 250 MG PO TABS
250.0000 mg | ORAL_TABLET | Freq: Two times a day (BID) | ORAL | Status: DC
  Administered 2016-11-01 – 2016-11-04 (×7): 250 mg via ORAL
  Filled 2016-11-01 (×9): qty 1

## 2016-11-01 MED ORDER — MORPHINE SULFATE (PF) 2 MG/ML IV SOLN: 2.0000 mg | INTRAVENOUS | Status: DC | PRN

## 2016-11-01 MED ORDER — ONDANSETRON HCL 4 MG/2ML IJ SOLN
4.0000 mg | Freq: Four times a day (QID) | INTRAMUSCULAR | Status: DC | PRN
  Administered 2016-11-01: 4 mg via INTRAVENOUS

## 2016-11-01 MED ORDER — POLYVINYL ALCOHOL 1.4 % OP SOLN
1.0000 [drp] | Freq: Three times a day (TID) | OPHTHALMIC | Status: DC | PRN
  Filled 2016-11-01: qty 15

## 2016-11-01 MED ORDER — LIDOCAINE HCL (PF) 2 % IJ SOLN
INTRAMUSCULAR | Status: AC
  Filled 2016-11-01: qty 4

## 2016-11-01 MED ORDER — METOCLOPRAMIDE HCL 10 MG PO TABS: 5.0000 mg | ORAL_TABLET | Freq: Three times a day (TID) | ORAL | Status: DC | PRN

## 2016-11-01 MED ORDER — DEXTROSE 5 % IV SOLN
2.0000 g | Freq: Once | INTRAVENOUS | Status: AC
  Administered 2016-11-01: 2 g via INTRAVENOUS
  Filled 2016-11-01: qty 20

## 2016-11-01 MED ORDER — DEXAMETHASONE SODIUM PHOSPHATE 10 MG/ML IJ SOLN
INTRAMUSCULAR | Status: DC | PRN
  Administered 2016-11-01: 6 mg via INTRAVENOUS

## 2016-11-01 MED ORDER — SODIUM CHLORIDE 0.9 % IV SOLN
INTRAVENOUS | Status: DC
  Administered 2016-11-01: 03:00:00 via INTRAVENOUS
  Administered 2016-11-01: 100 mL/h via INTRAVENOUS

## 2016-11-01 MED ORDER — PHENYLEPHRINE HCL 10 MG/ML IJ SOLN
INTRAMUSCULAR | Status: DC | PRN
  Administered 2016-11-01 (×4): 100 ug via INTRAVENOUS

## 2016-11-01 MED ORDER — ONDANSETRON HCL 4 MG PO TABS: 4.0000 mg | ORAL_TABLET | Freq: Four times a day (QID) | ORAL | Status: DC | PRN

## 2016-11-01 MED ORDER — CEFAZOLIN SODIUM-DEXTROSE 2-4 GM/100ML-% IV SOLN
2.0000 g | Freq: Once | INTRAVENOUS | Status: DC
  Filled 2016-11-01: qty 100

## 2016-11-01 MED ORDER — CEFAZOLIN SODIUM-DEXTROSE 1-4 GM/50ML-% IV SOLN
1.0000 g | Freq: Once | INTRAVENOUS | Status: DC
  Filled 2016-11-01: qty 50

## 2016-11-01 MED ORDER — DEXTROSE 5 % IV SOLN
1.0000 g | Freq: Every day | INTRAVENOUS | Status: DC
  Administered 2016-11-01: 1 g via INTRAVENOUS
  Filled 2016-11-01: qty 10

## 2016-11-01 MED ORDER — DEXTROSE 5 % IV SOLN
Freq: Once | INTRAVENOUS | Status: AC
  Administered 2016-11-01: 100 mL via INTRAVENOUS
  Filled 2016-11-01: qty 2000

## 2016-11-01 MED ORDER — ROCURONIUM BROMIDE 50 MG/5ML IV SOLN
INTRAVENOUS | Status: AC
  Filled 2016-11-01: qty 1

## 2016-11-01 MED ORDER — POLYETHYL GLYCOL-PROPYL GLYCOL 0.4-0.3 % OP SOLN: 1.0000 [drp] | Freq: Three times a day (TID) | OPHTHALMIC | Status: DC | PRN

## 2016-11-01 MED ORDER — ROCURONIUM BROMIDE 100 MG/10ML IV SOLN
INTRAVENOUS | Status: DC | PRN
  Administered 2016-11-01: 25 mg via INTRAVENOUS
  Administered 2016-11-01: 15 mg via INTRAVENOUS

## 2016-11-01 MED ORDER — LORATADINE 10 MG PO TABS
10.0000 mg | ORAL_TABLET | Freq: Every day | ORAL | Status: DC
  Administered 2016-11-02 – 2016-11-04 (×3): 10 mg via ORAL
  Filled 2016-11-01 (×3): qty 1

## 2016-11-01 MED ORDER — LIDOCAINE HCL (CARDIAC) 20 MG/ML IV SOLN
INTRAVENOUS | Status: DC | PRN
  Administered 2016-11-01: 60 mg via INTRAVENOUS

## 2016-11-01 MED ORDER — DOCUSATE SODIUM 100 MG PO CAPS
100.0000 mg | ORAL_CAPSULE | Freq: Two times a day (BID) | ORAL | Status: DC
  Administered 2016-11-01 – 2016-11-04 (×6): 100 mg via ORAL
  Filled 2016-11-01 (×6): qty 1

## 2016-11-01 MED ORDER — ONDANSETRON HCL 4 MG/2ML IJ SOLN: 4.0000 mg | Freq: Four times a day (QID) | INTRAMUSCULAR | Status: DC | PRN

## 2016-11-01 MED ORDER — METOCLOPRAMIDE HCL 5 MG/ML IJ SOLN: 5.0000 mg | Freq: Three times a day (TID) | INTRAMUSCULAR | Status: DC | PRN

## 2016-11-01 MED ORDER — FENTANYL CITRATE (PF) 100 MCG/2ML IJ SOLN
25.0000 ug | INTRAMUSCULAR | Status: DC | PRN
  Administered 2016-11-01 (×2): 25 ug via INTRAVENOUS

## 2016-11-01 MED ORDER — ACETAMINOPHEN 650 MG RE SUPP: 650.0000 mg | Freq: Four times a day (QID) | RECTAL | Status: DC | PRN

## 2016-11-01 MED ORDER — NYSTATIN 100000 UNIT/GM EX POWD
1.0000 g | Freq: Two times a day (BID) | CUTANEOUS | Status: DC | PRN
  Filled 2016-11-01: qty 15

## 2016-11-01 MED ORDER — INFLUENZA VAC SPLIT HIGH-DOSE 0.5 ML IM SUSY
0.5000 mL | PREFILLED_SYRINGE | INTRAMUSCULAR | Status: AC
  Administered 2016-11-04: 0.5 mL via INTRAMUSCULAR
  Filled 2016-11-01: qty 0.5

## 2016-11-01 MED ORDER — ADULT MULTIVITAMIN W/MINERALS CH
1.0000 | ORAL_TABLET | Freq: Every day | ORAL | Status: DC
  Administered 2016-11-02 – 2016-11-04 (×3): 1 via ORAL
  Filled 2016-11-01 (×3): qty 1

## 2016-11-01 MED ORDER — ACETAMINOPHEN 500 MG PO TABS
1000.0000 mg | ORAL_TABLET | Freq: Four times a day (QID) | ORAL | Status: AC
  Administered 2016-11-02 (×2): 1000 mg via ORAL
  Filled 2016-11-01 (×2): qty 2

## 2016-11-01 MED ORDER — FENTANYL CITRATE (PF) 100 MCG/2ML IJ SOLN
INTRAMUSCULAR | Status: AC
  Filled 2016-11-01: qty 2

## 2016-11-01 MED ORDER — MORPHINE SULFATE (PF) 2 MG/ML IV SOLN
2.0000 mg | INTRAVENOUS | Status: DC | PRN
  Administered 2016-11-01: 2 mg via INTRAVENOUS
  Filled 2016-11-01: qty 1

## 2016-11-01 MED ORDER — ONDANSETRON HCL 4 MG/2ML IJ SOLN: 4.0000 mg | Freq: Once | INTRAMUSCULAR | Status: DC | PRN

## 2016-11-01 MED ORDER — PROPOFOL 10 MG/ML IV BOLUS
INTRAVENOUS | Status: DC | PRN
  Administered 2016-11-01: 130 mg via INTRAVENOUS

## 2016-11-01 MED ORDER — SERTRALINE HCL 50 MG PO TABS
25.0000 mg | ORAL_TABLET | ORAL | Status: DC
  Administered 2016-11-01 – 2016-11-04 (×4): 25 mg via ORAL
  Filled 2016-11-01 (×4): qty 1

## 2016-11-01 MED ORDER — BUPIVACAINE-EPINEPHRINE (PF) 0.5% -1:200000 IJ SOLN
INTRAMUSCULAR | Status: DC | PRN
  Administered 2016-11-01: 30 mL via PERINEURAL

## 2016-11-01 MED ORDER — ENOXAPARIN SODIUM 40 MG/0.4ML ~~LOC~~ SOLN
40.0000 mg | SUBCUTANEOUS | Status: DC
  Administered 2016-11-02 – 2016-11-04 (×3): 40 mg via SUBCUTANEOUS
  Filled 2016-11-01 (×3): qty 0.4

## 2016-11-01 MED ORDER — HYDROCODONE-ACETAMINOPHEN 5-325 MG PO TABS
1.0000 | ORAL_TABLET | Freq: Two times a day (BID) | ORAL | Status: DC
  Administered 2016-11-01: 1 via ORAL
  Filled 2016-11-01: qty 1

## 2016-11-01 MED ORDER — PROPOFOL 500 MG/50ML IV EMUL
INTRAVENOUS | Status: AC
  Filled 2016-11-01: qty 50

## 2016-11-01 MED ORDER — SUGAMMADEX SODIUM 200 MG/2ML IV SOLN
INTRAVENOUS | Status: DC | PRN
  Administered 2016-11-01: 130 mg via INTRAVENOUS

## 2016-11-01 MED ORDER — ACETAMINOPHEN 325 MG PO TABS
650.0000 mg | ORAL_TABLET | Freq: Four times a day (QID) | ORAL | Status: DC | PRN
  Administered 2016-11-03: 650 mg via ORAL
  Filled 2016-11-01: qty 2

## 2016-11-01 MED ORDER — ACETAMINOPHEN 650 MG RE SUPP
650.0000 mg | Freq: Four times a day (QID) | RECTAL | Status: DC | PRN
  Administered 2016-11-01: 650 mg via RECTAL
  Filled 2016-11-01: qty 1

## 2016-11-01 MED ORDER — OXYCODONE HCL 5 MG PO TABS: 5.0000 mg | ORAL_TABLET | ORAL | Status: DC | PRN

## 2016-11-01 MED ORDER — POTASSIUM CHLORIDE 20 MEQ PO PACK: 20.0000 meq | PACK | Freq: Once | ORAL | Status: DC

## 2016-11-01 MED ORDER — METHOTREXATE 2.5 MG PO TABS: 10.0000 mg | ORAL_TABLET | ORAL | Status: DC

## 2016-11-01 MED ORDER — FOLIC ACID 1 MG PO TABS
1.0000 mg | ORAL_TABLET | Freq: Every day | ORAL | Status: DC
  Administered 2016-11-02 – 2016-11-04 (×3): 1 mg via ORAL
  Filled 2016-11-01 (×3): qty 1

## 2016-11-01 MED ORDER — HYDROCHLOROTHIAZIDE 12.5 MG PO CAPS
12.5000 mg | ORAL_CAPSULE | Freq: Every day | ORAL | Status: DC
  Administered 2016-11-02 – 2016-11-04 (×3): 12.5 mg via ORAL
  Filled 2016-11-01 (×4): qty 1

## 2016-11-01 MED ORDER — MEMANTINE HCL 5 MG PO TABS
5.0000 mg | ORAL_TABLET | Freq: Two times a day (BID) | ORAL | Status: DC
  Administered 2016-11-01 – 2016-11-04 (×7): 5 mg via ORAL
  Filled 2016-11-01 (×7): qty 1

## 2016-11-01 MED ORDER — SUGAMMADEX SODIUM 200 MG/2ML IV SOLN
INTRAVENOUS | Status: AC
  Filled 2016-11-01: qty 2

## 2016-11-01 MED ORDER — MAGNESIUM HYDROXIDE 400 MG/5ML PO SUSP
30.0000 mL | Freq: Every day | ORAL | Status: DC | PRN
  Filled 2016-11-01: qty 30

## 2016-11-01 MED ORDER — HYDRALAZINE HCL 20 MG/ML IJ SOLN: 10.0000 mg | Freq: Four times a day (QID) | INTRAMUSCULAR | Status: DC | PRN

## 2016-11-01 MED ORDER — CEFAZOLIN SODIUM-DEXTROSE 2-4 GM/100ML-% IV SOLN
2.0000 g | Freq: Four times a day (QID) | INTRAVENOUS | Status: DC
  Filled 2016-11-01 (×3): qty 100

## 2016-11-01 MED ORDER — MELATONIN 5 MG PO TABS
5.0000 mg | ORAL_TABLET | Freq: Every day | ORAL | Status: DC
  Administered 2016-11-01 – 2016-11-03 (×3): 5 mg via ORAL
  Filled 2016-11-01 (×4): qty 1

## 2016-11-01 MED ORDER — BISACODYL 10 MG RE SUPP: 10.0000 mg | Freq: Every day | RECTAL | Status: DC | PRN

## 2016-11-01 MED ORDER — ACETAMINOPHEN 325 MG PO TABS: 650.0000 mg | ORAL_TABLET | Freq: Four times a day (QID) | ORAL | Status: DC | PRN

## 2016-11-01 MED ORDER — SUCCINYLCHOLINE CHLORIDE 20 MG/ML IJ SOLN
INTRAMUSCULAR | Status: DC | PRN
  Administered 2016-11-01: 100 mg via INTRAVENOUS

## 2016-11-01 MED ORDER — FLEET ENEMA 7-19 GM/118ML RE ENEM
1.0000 | ENEMA | Freq: Once | RECTAL | Status: DC | PRN
Start: 2016-11-01 — End: 2016-11-04

## 2016-11-01 MED ORDER — EPHEDRINE SULFATE 50 MG/ML IJ SOLN
INTRAMUSCULAR | Status: AC
  Filled 2016-11-01: qty 1

## 2016-11-01 MED ORDER — PNEUMOCOCCAL VAC POLYVALENT 25 MCG/0.5ML IJ INJ
0.5000 mL | INJECTION | INTRAMUSCULAR | Status: AC
  Administered 2016-11-04: 0.5 mL via INTRAMUSCULAR
  Filled 2016-11-01: qty 0.5

## 2016-11-01 MED ORDER — NEOMYCIN-POLYMYXIN B GU 40-200000 IR SOLN
Status: DC | PRN
  Administered 2016-11-01: 2 mL

## 2016-11-01 MED ORDER — DEXTROSE 5 % IV SOLN
2.0000 g | Freq: Four times a day (QID) | INTRAVENOUS | Status: AC
  Administered 2016-11-01 – 2016-11-02 (×2): 2 g via INTRAVENOUS
  Filled 2016-11-01 (×3): qty 20

## 2016-11-01 MED ORDER — CEFAZOLIN SODIUM-DEXTROSE 2-4 GM/100ML-% IV SOLN
INTRAVENOUS | Status: AC
  Filled 2016-11-01: qty 100

## 2016-11-01 MED ORDER — HYDROCODONE-ACETAMINOPHEN 5-325 MG PO TABS: 1.0000 | ORAL_TABLET | ORAL | Status: DC | PRN

## 2016-11-01 MED ORDER — FENTANYL CITRATE (PF) 100 MCG/2ML IJ SOLN
INTRAMUSCULAR | Status: AC
  Administered 2016-11-01: 25 ug via INTRAVENOUS
  Filled 2016-11-01: qty 2

## 2016-11-01 MED ORDER — DEXAMETHASONE SODIUM PHOSPHATE 10 MG/ML IJ SOLN
INTRAMUSCULAR | Status: AC
  Filled 2016-11-01: qty 1

## 2016-11-01 MED ORDER — ONDANSETRON HCL 4 MG/2ML IJ SOLN
INTRAMUSCULAR | Status: AC
  Filled 2016-11-01: qty 2

## 2016-11-01 MED ORDER — FENTANYL CITRATE (PF) 100 MCG/2ML IJ SOLN
INTRAMUSCULAR | Status: DC | PRN
  Administered 2016-11-01 (×2): 25 ug via INTRAVENOUS
  Administered 2016-11-01 (×2): 50 ug via INTRAVENOUS

## 2016-11-01 MED ORDER — SODIUM CHLORIDE 0.9 % IV SOLN
INTRAVENOUS | Status: DC | PRN
  Administered 2016-11-01: 50 ug/min via INTRAVENOUS

## 2016-11-01 MED ORDER — DIPHENHYDRAMINE HCL 12.5 MG/5ML PO ELIX: 12.5000 mg | ORAL_SOLUTION | ORAL | Status: DC | PRN

## 2016-11-01 SURGICAL SUPPLY — 41 items
BIT DRILL 4.3MMS DISTAL GRDTED (BIT) IMPLANT
BNDG COHESIVE 4X5 TAN STRL (GAUZE/BANDAGES/DRESSINGS) ×3 IMPLANT
BNDG COHESIVE 6X5 TAN STRL LF (GAUZE/BANDAGES/DRESSINGS) ×3 IMPLANT
CANISTER SUCT 1200ML W/VALVE (MISCELLANEOUS) ×3 IMPLANT
CHLORAPREP W/TINT 26ML (MISCELLANEOUS) ×4 IMPLANT
DRAPE C-ARMOR (DRAPES) ×3 IMPLANT
DRAPE SHEET LG 3/4 BI-LAMINATE (DRAPES) ×3 IMPLANT
DRILL 4.3MMS DISTAL GRADUATED (BIT) ×3
DRSG OPSITE POSTOP 3X4 (GAUZE/BANDAGES/DRESSINGS) ×9 IMPLANT
DRSG OPSITE POSTOP 4X6 (GAUZE/BANDAGES/DRESSINGS) ×3 IMPLANT
ELECT CAUTERY BLADE 6.4 (BLADE) ×3 IMPLANT
ELECT REM PT RETURN 9FT ADLT (ELECTROSURGICAL) ×3
ELECTRODE REM PT RTRN 9FT ADLT (ELECTROSURGICAL) ×1 IMPLANT
GAUZE SPONGE 4X4 12PLY STRL (GAUZE/BANDAGES/DRESSINGS) ×3 IMPLANT
GLOVE BIO SURGEON STRL SZ8 (GLOVE) ×6 IMPLANT
GLOVE INDICATOR 8.0 STRL GRN (GLOVE) ×3 IMPLANT
GOWN STRL REUS W/ TWL LRG LVL3 (GOWN DISPOSABLE) ×1 IMPLANT
GOWN STRL REUS W/ TWL XL LVL3 (GOWN DISPOSABLE) ×1 IMPLANT
GOWN STRL REUS W/TWL LRG LVL3 (GOWN DISPOSABLE) ×3
GOWN STRL REUS W/TWL XL LVL3 (GOWN DISPOSABLE) ×3
GUIDEPIN VERSANAIL DSP 3.2X444 (ORTHOPEDIC DISPOSABLE SUPPLIES) ×2 IMPLANT
GUIDEWIRE BALL NOSE 100CM (WIRE) ×2 IMPLANT
HFN LH 130 DEG 11MM X 380MM (Orthopedic Implant) ×2 IMPLANT
HIP FRA NAIL LAG SCREW 10.5X90 (Orthopedic Implant) ×3 IMPLANT
MAT BLUE FLOOR 46X72 FLO (MISCELLANEOUS) ×3 IMPLANT
NDL FILTER BLUNT 18X1 1/2 (NEEDLE) ×1 IMPLANT
NEEDLE FILTER BLUNT 18X 1/2SAF (NEEDLE) ×2
NEEDLE FILTER BLUNT 18X1 1/2 (NEEDLE) ×1 IMPLANT
NEEDLE HYPO 22GX1.5 SAFETY (NEEDLE) ×3 IMPLANT
NS IRRIG 500ML POUR BTL (IV SOLUTION) ×3 IMPLANT
PACK HIP COMPR (MISCELLANEOUS) ×3 IMPLANT
SCREW BONE CORTICAL 5.0X40 (Screw) ×2 IMPLANT
SCREW LAG HIP FRA NAIL 10.5X90 (Orthopedic Implant) IMPLANT
STAPLER SKIN PROX 35W (STAPLE) ×3 IMPLANT
STRAP SAFETY BODY (MISCELLANEOUS) ×3 IMPLANT
SUT VIC AB 0 CT1 36 (SUTURE) ×2 IMPLANT
SUT VIC AB 1 CT1 36 (SUTURE) ×3 IMPLANT
SUT VIC AB 2-0 CT1 (SUTURE) ×6 IMPLANT
SYR 30ML LL (SYRINGE) ×3 IMPLANT
SYRINGE 10CC LL (SYRINGE) ×3 IMPLANT
TAPE MICROFOAM 4IN (TAPE) ×3 IMPLANT

## 2016-11-01 NOTE — Progress Notes (Signed)
[facility] - [place] at [facility]                                                                                                                                                                                  Patient Demographics   Kim Morgan, is a 70 y.o. female, DOB - 18-Sep-1945, INO:[id number]  Admit date - 11/01/2016   Admitting Physician Arnaldo Natal, MD  Outpatient Primary MD for the patient is System, Pcp Not In   LOS - 0  Subjective: Patient admitted with hip fracture she has dementia and is confused    Review of Systems:   CONSTITUTIONAL: confused  Vitals:   Vitals:   11/01/16 0732 11/01/16 0840 11/01/16 0959 11/01/16 1250  BP: (!) 154/110 (!) 101/50 (!) 148/98 (!) 154/81  Pulse: (!) 125 (!) 122  (!) 107  Resp:    16  Temp: 99.4 F (37.4 C)   99.5 F (37.5 C)  TempSrc: Axillary   Tympanic  SpO2: 95% 93%  94%  Weight:      Height:        Wt Readings from Last 3 Encounters:  11/01/16 150 lb 6.4 oz (68.2 kg)  05/16/16 147 lb 8 oz (66.9 kg)  07/18/15 170 lb (77.1 kg)     Intake/Output Summary (Last 24 hours) at 11/01/16 1440 Last data filed at 11/01/16 1232  Gross per 24 hour  Intake            98.33 ml  Output              650 ml  Net          -551.67 ml    Physical Exam:   GENERAL: Pleasant-appearing in no apparent distress.  HEAD, EYES, EARS, NOSE AND THROAT: Atraumatic, normocephalic. Extraocular muscles are intact. Pupils equal and reactive to light. Sclerae anicteric. No conjunctival injection. No oro-pharyngeal erythema.  NECK: Supple. There is no jugular venous distention. No bruits, no lymphadenopathy, no thyromegaly.  HEART: Regular rate and rhythm,. No murmurs, no rubs, no clicks.  LUNGS: Clear to auscultation bilaterally. No rales or rhonchi. No wheezes.  ABDOMEN: Soft, flat, nontender, nondistended. Has good bowel sounds. No hepatosplenomegaly appreciated.  EXTREMITIES: No evidence of any cyanosis, clubbing, or  peripheral edema.  +2 pedal and radial pulses bilaterally.  NEUROLOGIC: patient confused SKIN: Moist and warm with no rashes appreciated.  Psych: confused LN: No inguinal LN enlargement    Antibiotics   Anti-infectives    Start     Dose/Rate Route Frequency Ordered Stop   11/01/16 1415  ceFAZolin (ANCEF) IVPB 1 g/50 mL premix  Status:  Discontinued     1  g 100 mL/hr over 30 Minutes Intravenous  Once 11/01/16 0911 11/01/16 1315   11/01/16 1415  ceFAZolin (ANCEF) IVPB 2g/100 mL premix  Status:  Discontinued     2 g 200 mL/hr over 30 Minutes Intravenous  Once 11/01/16 1315 11/01/16 1333   11/01/16 1415  ceFAZolin (ANCEF) 2 g in dextrose 5 % 100 mL injection     200 mL/hr over 30 Minutes Intravenous  Once 11/01/16 1333 11/01/16 1440   11/01/16 1322  ceFAZolin (ANCEF) 2-4 GM/100ML-% IVPB    Comments:  Dellis Filbert   : cabinet override      11/01/16 1322 11/02/16 0129   11/01/16 0900  cefTRIAXone (ROCEPHIN) 1 g in dextrose 5 % 50 mL IVPB  Status:  Discontinued     1 g 100 mL/hr over 30 Minutes Intravenous Daily 11/01/16 0826 11/01/16 1315      Medications   Scheduled Meds: . docusate sodium  100 mg Oral BID  . folic acid  1 mg Oral Daily  . hydrochlorothiazide  12.5 mg Oral Daily  . HYDROcodone-acetaminophen  1 tablet Oral BID  . [START ON 11/02/2016] Influenza vac split quadrivalent PF  0.5 mL Intramuscular Tomorrow-1000  . levETIRAcetam  250 mg Oral BID  . loratadine  10 mg Oral Daily  . Melatonin  5 mg Oral QHS  . memantine  5 mg Oral BID  . [START ON 11/07/2016] methotrexate  10 mg Oral Weekly  . multivitamin with minerals  1 tablet Oral Daily  . [START ON 11/02/2016] pneumococcal 23 valent vaccine  0.5 mL Intramuscular Tomorrow-1000  . potassium chloride  20 mEq Oral Once  . sertraline  25 mg Oral BH-q7a   Continuous Infusions: . sodium chloride 100 mL/hr (11/01/16 1320)  . ceFAZolin     PRN Meds:.acetaminophen **OR** acetaminophen, hydrALAZINE,  HYDROcodone-acetaminophen, morphine injection, nystatin, ondansetron **OR** ondansetron (ZOFRAN) IV, polyvinyl alcohol   Data Review:   Micro Results Recent Results (from the past 240 hour(s))  MRSA PCR Screening     Status: None   Collection Time: 11/01/16  3:23 AM  Result Value Ref Range Status   MRSA by PCR NEGATIVE NEGATIVE Final    Comment:        The GeneXpert MRSA Assay (FDA approved for NASAL specimens only), is one component of a comprehensive MRSA colonization surveillance program. It is not intended to diagnose MRSA infection nor to guide or monitor treatment for MRSA infections.     Radiology Reports Ct Head Wo Contrast  Result Date: 11/01/2016 CLINICAL DATA:  Head trauma EXAM: CT HEAD WITHOUT CONTRAST TECHNIQUE: Contiguous axial images were obtained from the base of the skull through the vertex without intravenous contrast. COMPARISON:  Head CT 07/18/2015 FINDINGS: Brain: No mass lesion, intraparenchymal hemorrhage or extra-axial collection. No evidence of acute cortical infarct. There is periventricular hypoattenuation compatible with chronic microvascular disease. Unchanged age advanced atrophy with dilatation of the lateral ventricles. Vascular: No hyperdense vessel or unexpected calcification. Skull: Normal visualized skull base, calvarium and extracranial soft tissues. Sinuses/Orbits: No sinus fluid levels or advanced mucosal thickening. No mastoid effusion. Normal orbits. IMPRESSION: Unchanged age advanced atrophy and findings of chronic ischemic microangiopathy. No acute abnormality. Electronically Signed   By: Deatra Robinson M.D.   On: 11/01/2016 02:14   Dg Chest Portable 1 View  Result Date: 11/01/2016 CLINICAL DATA:  Preop for hip fracture. EXAM: PORTABLE CHEST 1 VIEW COMPARISON:  05/13/2016 CXR and chest CT 01/22/2007. FINDINGS: The heart size and mediastinal contours are within normal  limits. Moderate aortic atherosclerosis at the arch. Calcified 14 x 17 mm  nodule in the expected location of the left thyroid gland, confirmed on prior chest CT. Mild interstitial prominence is again noted which may reflect chronic bronchitic change. No pneumonia or CHF. The visualized skeletal structures are unremarkable. IMPRESSION: 1. No pneumonic consolidations. 2. Chronic bronchitic change. 3. Aortic atherosclerosis. 4. Calcified left 14 x 17 mm thyroid nodule. Electronically Signed   By: Tollie Eth M.D.   On: 11/01/2016 02:17   Dg Hip Unilat W Or Wo Pelvis 2-3 Views Left  Result Date: 11/01/2016 CLINICAL DATA:  Fall with hip pain EXAM: DG HIP (WITH OR WITHOUT PELVIS) 2-3V LEFT COMPARISON:  None. FINDINGS: There is an acute fracture of the left femoral neck with medial angulation of the femoral shaft relative to the femoral head. There is mild overriding. femoroacetabular joint remains approximated. No pelvic diastasis. IMPRESSION: Left femoral neck fracture. Electronically Signed   By: Deatra Robinson M.D.   On: 11/01/2016 02:10     CBC  Recent Labs Lab 11/01/16 0113  WBC 10.7  HGB 12.9  HCT 37.7  PLT 250  MCV 88.2  MCH 30.3  MCHC 34.3  RDW 14.9*    Chemistries   Recent Labs Lab 11/01/16 0113  NA 142  K 3.4*  CL 107  CO2 25  GLUCOSE 112*  BUN 19  CREATININE 1.02*  CALCIUM 9.3   ------------------------------------------------------------------------------------------------------------------ estimated creatinine clearance is 48.7 mL/min (A) (by C-G formula based on SCr of 1.02 mg/dL (H)). ------------------------------------------------------------------------------------------------------------------  Recent Labs  11/01/16 0113  HGBA1C 5.2   ------------------------------------------------------------------------------------------------------------------ No results for input(s): CHOL, HDL, LDLCALC, TRIG, CHOLHDL, LDLDIRECT in the last 72  hours. ------------------------------------------------------------------------------------------------------------------  Recent Labs  11/01/16 0113  TSH 3.487   ------------------------------------------------------------------------------------------------------------------ No results for input(s): VITAMINB12, FOLATE, FERRITIN, TIBC, IRON, RETICCTPCT in the last 72 hours.  Coagulation profile No results for input(s): INR, PROTIME in the last 168 hours.  No results for input(s): DDIMER in the last 72 hours.  Cardiac Enzymes  Recent Labs Lab 11/01/16 0113  TROPONINI <0.03   ------------------------------------------------------------------------------------------------------------------ Invalid input(s): POCBNP    Assessment & Plan   This is a 71 year old female admitted for hip fracture. 1. Urinary tract infection: I will obtain urine culture start patient on IV antibiotics with ceftriaxone 2. Hypertension: uncontrolled, likely secondary to pain. Continue HCTZ, I have added when necessary IV hydralazine 3. Dementia: continue Namenda 4. Depression: continue Zoloft 5. ? Seizure disorder: continue Keppra 6. ? Autoimmune disease: hold methotrexate for now.  7. DVT prophylaxis: SCD's 8. GI prophylaxis: none        Code Status Orders        Start     Ordered   11/01/16 0313  Do not attempt resuscitation (DNR)  Continuous    Question Answer Comment  In the event of cardiac or respiratory ARREST Do not call a "code blue"   In the event of cardiac or respiratory ARREST Do not perform Intubation, CPR, defibrillation or ACLS   In the event of cardiac or respiratory ARREST Use medication by any route, position, wound care, and other measures to relive pain and suffering. May use oxygen, suction and manual treatment of airway obstruction as needed for comfort.      11/01/16 9147    Code Status History    Date Active Date Inactive Code Status Order ID Comments User  Context   05/14/2016 11:17 AM 05/16/2016  5:30 PM DNR [id number]  Milagros Loll, MD  Inpatient   05/13/2016  6:47 PM 05/14/2016 11:17 AM Full Code [id number]  Katha Hamming, MD ED   07/18/2015  7:10 AM 07/21/2015  2:40 PM Full Code [id number]  Ihor Austin, MD ED           Consults  neurology  DVT Prophylaxis Lovenox postop  Lab Results  Component Value Date   PLT 250 11/01/2016     Time Spent in minutes  45 minutes spent Greater than 50% of time spent in care coordination and counseling patient regarding the condition and plan of care.   Auburn Bilberry M.D on 11/01/2016 at 2:40 PM  Between 7am to 6pm - Pager - [phone]  After 6pm go to [email or web address] - password [email or web address] [facility]  [facility]   Office  [phone]

## 2016-11-01 NOTE — Clinical Social Work Note (Signed)
Clinical Social Work Assessment  Patient Details  Name: Kim Morgan MRN: [id number] Date of Birth: 1945/05/22  Date of referral:  11/01/16               Reason for consult:  Facility Placement                Permission sought to share information with:  Chartered certified accountant granted to share information::  Yes, Verbal Permission Granted  Name:: Kim Morgan::   [place]   Relationship::   Spouse  Contact [phone] Housing/Transportation Living arrangements for the past 2 months:  [place] of Information:  Spouse Patient Interpreter Needed:  None Criminal Activity/Legal Involvement Pertinent to Current Situation/Hospitalization:  No - Comment as needed Significant Relationships:  Spouse Lives with:  Facility Resident Do you feel safe going back to the place where you live?  Yes Need for family participation in patient care:  Yes (Comment)  Care giving concerns:  Patient is a resident at [facility].    Social Worker assessment / plan: Social work Theatre manager reviewed chart and noted that patient is from [facility] and will have surgery today for a hip fracture. Social Work Theatre manager met with patient and her husband Kim Morgan at bedside prior to surgery today. Patient was sleeping and did not participate in assessment. Her husband Kim Morgan [phone]) was by her bedside. Her husband explained that she is currently at [facility]. Social work Theatre manager explained to husband that she will be in need of SNF for short term rehab prior to returning to [facility]. Husband prefers [facility]. Social work Theatre manager explained to husband that PT will evaluate patient after surgery and that Medicare requires a 3 night qualifying inpatient stay, in a hospital in order to pay for SNF (patient was admitted to inpatient on 11/01/16). Husband verbalized his understanding and is agreeable to SNF search in [place].  FL2 complete and faxed out. PASARR is pending. Social work Dealer ([place]) will continue to follow and assist as needed.   Employment status:  Disabled (Comment on whether or not currently receiving Disability), Retired Forensic scientist:  Medicare PT Recommendations:    Information / Referral to community resources:  [place]  Patient/Family's Response to care:  Patient's spouse was agreeable to SNF option.  Patient/Family's Understanding of and Emotional Response to Diagnosis, Current Treatment, and Prognosis:  Patient's husband was plesant and thanked social work Theatre manager for assistance.   Emotional Assessment Appearance:  Appears stated age Attitude/Demeanor/Rapport:  Unable to Assess Affect (typically observed):  Unable to Assess Orientation:  Oriented to Self Alcohol / Substance use:  Not Applicable Psych involvement (Current and /or in the community):  No (Comment)  Discharge Needs  Concerns to be addressed:  Discharge Planning Concerns Readmission within the last 30 days:  No Current discharge risk:  Dependent with Mobility Barriers to Discharge:  Continued Medical Work up   Smith Mince, Student-Social Work 11/01/2016, 1:45 PM

## 2016-11-01 NOTE — ED Notes (Signed)
Fall alarm placed on pt at this time as well as floor mats.

## 2016-11-01 NOTE — Consult Note (Signed)
High left intertroch fracture, plan ORIF later today if medically stable.

## 2016-11-01 NOTE — Clinical Social Work Placement (Signed)
   CLINICAL SOCIAL WORK PLACEMENT  NOTE  Date:  11/01/2016  Patient Details  Name: Kim Morgan MRN: [id number] Date of Birth: [birth year]  Clinical Social Work is seeking post-discharge placement for this patient at the Skilled  Nursing Facility level of care (*CSW will initial, date and re-position this form in  chart as items are completed):  Yes   Patient/family provided with [facility] Clinical Social Work Department's list of facilities offering this level of care within the geographic area requested by the patient (or if unable, by the patient's family).  Yes   Patient/family informed of their freedom to choose among providers that offer the needed level of care, that participate in Medicare, Medicaid or managed care program needed by the patient, have an available bed and are willing to accept the patient.  Yes   Patient/family informed of [place]'s ownership interest in [facility] and [facility], as well as of the fact that they are under no obligation to receive care at these facilities.  PASRR submitted to EDS on 11/01/16     PASRR number received on       Existing PASRR number confirmed on       FL2 transmitted to all facilities in geographic area requested by pt/family on 11/01/16     FL2 transmitted to all facilities within larger geographic area on       Patient informed that his/her managed care company has contracts with or will negotiate with certain facilities, including the following:            Patient/family informed of bed offers received.  Patient chooses bed at       Physician recommends and patient chooses bed at      Patient to be transferred to   on  .  Patient to be transferred to facility by       Patient family notified on   of transfer.  Name of family member notified:        PHYSICIAN       Additional Comment:    _______________________________________________ Roman Dubuc, Darleen Crocker, LCSW 11/01/2016, 3:18 PM

## 2016-11-01 NOTE — [place] FL2 (Signed)
[place] MEDICAID FL2 LEVEL OF CARE SCREENING TOOL     IDENTIFICATION  Patient Name: Kim Morgan Birthdate: 13-Sep-1945 Sex: female Admission Date (Current Location): 11/01/2016  [place] and [place] Number:  Chiropodist and Address:  [facility], [address], [place], [place] [address]      Provider Number: [id number]  Attending Physician Name and Address:  Auburn Bilberry, MD  Relative Name and Phone Number:       Current Level of Care: Hospital Recommended Level of Care: Skilled Nursing Facility Prior Approval Number:    Date Approved/Denied:   PASRR Number:    Discharge Plan: SNF    Current Diagnoses: Patient Active Problem List   Diagnosis Date Noted  . Hip fracture (HCC) 11/01/2016  . Acute encephalopathy   . Hypothermia 07/18/2015  . Sepsis (HCC) 07/18/2015    Orientation RESPIRATION BLADDER Height & Weight     Self, Place  Normal Continent Weight: 150 lb 6.4 oz (68.2 kg) Height:  5\' 6"  (167.6 cm)  BEHAVIORAL SYMPTOMS/MOOD NEUROLOGICAL BOWEL NUTRITION STATUS      Continent Diet (NPO to be advanced)  AMBULATORY STATUS COMMUNICATION OF NEEDS Skin   Extensive Assist Verbally Normal                       Personal Care Assistance Level of Assistance  Bathing, Feeding, Dressing Bathing Assistance: Limited assistance Feeding assistance: Independent Dressing Assistance: Limited assistance     Functional Limitations Info  Sight, Hearing, Speech Sight Info: Adequate Hearing Info: Adequate Speech Info: Adequate    SPECIAL CARE FACTORS FREQUENCY  PT (By licensed PT), OT (By licensed OT)     PT Frequency:  (5) OT Frequency:  (5)            Contractures      Additional Factors Info  Code Status, Allergies Code Status Info:  (DNR) Allergies Info:  (None)           Current Medications (11/01/2016):  This is the current hospital active medication list Current Facility-Administered Medications   Medication Dose Route Frequency Provider Last Rate Last Dose  . 0.9 %  sodium chloride infusion   Intravenous Continuous 11/03/2016, MD 100 mL/hr at 11/01/16 0325    . acetaminophen (TYLENOL) tablet 650 mg  650 mg Oral Q6H PRN 11/03/16, MD       Or  . acetaminophen (TYLENOL) suppository 650 mg  650 mg Rectal Q6H PRN Arnaldo Natal, MD      . ceFAZolin (ANCEF) IVPB 1 g/50 mL premix  1 g Intravenous Once Arnaldo Natal, MD      . cefTRIAXone (ROCEPHIN) 1 g in dextrose 5 % 50 mL IVPB  1 g Intravenous Daily Kennedy Bucker, MD 100 mL/hr at 11/01/16 0903 1 g at 11/01/16 0903  . docusate sodium (COLACE) capsule 100 mg  100 mg Oral BID 11/03/16, MD      . folic acid (FOLVITE) tablet 1 mg  1 mg Oral Daily Arnaldo Natal, MD      . hydrALAZINE (APRESOLINE) injection 10 mg  10 mg Intravenous Q6H PRN Arnaldo Natal, MD      . hydrochlorothiazide (MICROZIDE) capsule 12.5 mg  12.5 mg Oral Daily Auburn Bilberry, MD      . HYDROcodone-acetaminophen (NORCO/VICODIN) 5-325 MG per tablet 1 tablet  1 tablet Oral BID Arnaldo Natal, MD      . HYDROcodone-acetaminophen (NORCO/VICODIN) 5-325 MG  per tablet 1 tablet  1 tablet Oral Q4H PRN Arnaldo Natal, MD      . Melene Muller ON 11/02/2016] Influenza vac split quadrivalent PF (FLUZONE HIGH-DOSE) injection 0.5 mL  0.5 mL Intramuscular Tomorrow-1000 Arnaldo Natal, MD      . levETIRAcetam (KEPPRA) tablet 250 mg  250 mg Oral BID Arnaldo Natal, MD   250 mg at 11/01/16 0844  . loratadine (CLARITIN) tablet 10 mg  10 mg Oral Daily Arnaldo Natal, MD      . Melatonin TABS 5 mg  5 mg Oral QHS Arnaldo Natal, MD      . memantine [facility]) tablet 5 mg  5 mg Oral BID Arnaldo Natal, MD   5 mg at 11/01/16 0844  . [START ON 11/07/2016] methotrexate (RHEUMATREX) tablet 10 mg  10 mg Oral Weekly Arnaldo Natal, MD      . morphine 2 MG/ML injection 2-4 mg  2-4 mg Intravenous Q4H PRN Arnaldo Natal, MD   2 mg at 11/01/16  0405  . multivitamin with minerals tablet 1 tablet  1 tablet Oral Daily Arnaldo Natal, MD      . nystatin (MYCOSTATIN/NYSTOP) topical powder 1 g  1 g Topical BID PRN Arnaldo Natal, MD      . ondansetron [facility]) tablet 4 mg  4 mg Oral Q6H PRN Arnaldo Natal, MD       Or  . ondansetron [facility]) injection 4 mg  4 mg Intravenous Q6H PRN Arnaldo Natal, MD      . Melene Muller ON 11/02/2016] pneumococcal 23 valent vaccine (PNU-IMMUNE) injection 0.5 mL  0.5 mL Intramuscular Tomorrow-1000 Arnaldo Natal, MD      . polyvinyl alcohol (LIQUIFILM TEARS) 1.4 % ophthalmic solution 1 drop  1 drop Both Eyes TID PRN Arnaldo Natal, MD      . sertraline (ZOLOFT) tablet 25 mg  25 mg Oral Clarita Leber, MD   25 mg at 11/01/16 1751     Discharge Medications: Please see discharge summary for a list of discharge medications.  Relevant Imaging Results:  Relevant Lab Results:   Additional Information  (SSN: [id number])  Payton Spark, Student-Social Work

## 2016-11-01 NOTE — H&P (Signed)
Kim Morgan is an 71 y.o. female.   Chief Complaint: fall HPI: the patient with past medical history of hypertension, osteoarthritis and dementia presents to the emergency department after a fall. The fall was witnessed but we do not have details of the circumstances.the patient is unable to ambulate and favors her left leg but does not appear to be in pain. Radiographic findings significant for left trochanteric fracture. Orthopedic surgery was notified and the emergency department contacted the hospitalist service for admission.  Past Medical History:  Diagnosis Date  . Dementia   . Depression   . Hypertension   . Osteoarthritis     History reviewed. No pertinent surgical history. Patient is pleasantly demented and cannot contribute to history  No family history on file. Patient cannot contribute Social History:  reports that she has quit smoking. She quit after 45.00 years of use. She has never used smokeless tobacco. She reports that she does not drink alcohol or use drugs.  Allergies: No Known Allergies  Medications Prior to Admission  Medication Sig Dispense Refill  . folic acid (FOLVITE) 1 MG tablet Take 1 mg by mouth daily.    . hydrochlorothiazide (MICROZIDE) 12.5 MG capsule Take 12.5 mg by mouth daily.    Marland Kitchen HYDROcodone-acetaminophen (NORCO/VICODIN) 5-325 MG tablet Take 1 tablet by mouth 2 (two) times daily.    Marland Kitchen HYDROcodone-acetaminophen (NORCO/VICODIN) 5-325 MG tablet Take 1 tablet by mouth every 4 (four) hours as needed for moderate pain.    Marland Kitchen levETIRAcetam (KEPPRA) 250 MG tablet Take 250 mg by mouth 2 (two) times daily.    Marland Kitchen loratadine (CLARITIN) 10 MG tablet Take 10 mg by mouth daily.    . Melatonin 5 MG TABS Take 5 mg by mouth at bedtime.     . memantine (NAMENDA) 5 MG tablet Take 5 mg by mouth 2 (two) times daily.    . methotrexate (RHEUMATREX) 2.5 MG tablet Take 10 mg by mouth once a week. Caution:Chemotherapy. Protect from light.    . Multiple Vitamin (DAILY VITE)  TABS Take 1 tablet by mouth daily.    Marland Kitchen nystatin (NYSTATIN) powder Apply 1 g topically 2 (two) times daily as needed. Apply to areas that are inflammed and look yeast-like. Apply until rash resolved.    . ondansetron (ZOFRAN) 4 MG tablet Take 1 tablet (4 mg total) by mouth daily as needed for nausea or vomiting. 20 tablet 1  . Polyethyl Glycol-Propyl Glycol (SYSTANE ULTRA) 0.4-0.3 % SOLN Apply 1 drop to eye 3 (three) times daily as needed (dry eyes).     . sertraline (ZOLOFT) 25 MG tablet Take 25 mg by mouth every morning.       Results for orders placed or performed during the hospital encounter of 11/01/16 (from the past 48 hour(s))  CBC     Status: Abnormal   Collection Time: 11/01/16  1:13 AM  Result Value Ref Range   WBC 10.7 3.6 - 11.0 K/uL   RBC 4.27 3.80 - 5.20 MIL/uL   Hemoglobin 12.9 12.0 - 16.0 g/dL   HCT 37.7 35.0 - 47.0 %   MCV 88.2 80.0 - 100.0 fL   MCH 30.3 26.0 - 34.0 pg   MCHC 34.3 32.0 - 36.0 g/dL   RDW 14.9 (H) 11.5 - 14.5 %   Platelets 250 150 - 440 K/uL  Basic metabolic panel     Status: Abnormal   Collection Time: 11/01/16  1:13 AM  Result Value Ref Range   Sodium 142 135 -  145 mmol/L   Potassium 3.4 (L) 3.5 - 5.1 mmol/L   Chloride 107 101 - 111 mmol/L   CO2 25 22 - 32 mmol/L   Glucose, Bld 112 (H) 65 - 99 mg/dL   BUN 19 6 - 20 mg/dL   Creatinine, Ser 1.02 (H) 0.44 - 1.00 mg/dL   Calcium 9.3 8.9 - 10.3 mg/dL   GFR calc non Af Amer 55 (L) >60 mL/min   GFR calc Af Amer >60 >60 mL/min    Comment: (NOTE) The eGFR has been calculated using the CKD EPI equation. This calculation has not been validated in all clinical situations. eGFR's persistently <60 mL/min signify possible Chronic Kidney Disease.    Anion gap 10 5 - 15  Urinalysis, Complete w Microscopic     Status: Abnormal   Collection Time: 11/01/16  1:13 AM  Result Value Ref Range   Color, Urine AMBER (A) YELLOW    Comment: BIOCHEMICALS MAY BE AFFECTED BY COLOR   APPearance CLOUDY (A) CLEAR    Specific Gravity, Urine 1.016 1.005 - 1.030   pH 5.0 5.0 - 8.0   Glucose, UA NEGATIVE NEGATIVE mg/dL   Hgb urine dipstick NEGATIVE NEGATIVE   Bilirubin Urine NEGATIVE NEGATIVE   Ketones, ur NEGATIVE NEGATIVE mg/dL   Protein, ur NEGATIVE NEGATIVE mg/dL   Nitrite NEGATIVE NEGATIVE   Leukocytes, UA MODERATE (A) NEGATIVE   RBC / HPF 0-5 0 - 5 RBC/hpf   WBC, UA TOO NUMEROUS TO COUNT 0 - 5 WBC/hpf   Bacteria, UA MANY (A) NONE SEEN   Squamous Epithelial / LPF 0-5 (A) NONE SEEN   WBC Clumps PRESENT    Mucus PRESENT   Troponin I     Status: None   Collection Time: 11/01/16  1:13 AM  Result Value Ref Range   Troponin I <0.03 <0.03 ng/mL  TSH     Status: None   Collection Time: 11/01/16  1:13 AM  Result Value Ref Range   TSH 3.487 0.350 - 4.500 uIU/mL    Comment: Performed by a 3rd Generation assay with a functional sensitivity of <=0.01 uIU/mL.  MRSA PCR Screening     Status: None   Collection Time: 11/01/16  3:23 AM  Result Value Ref Range   MRSA by PCR NEGATIVE NEGATIVE    Comment:        The GeneXpert MRSA Assay (FDA approved for NASAL specimens only), is one component of a comprehensive MRSA colonization surveillance program. It is not intended to diagnose MRSA infection nor to guide or monitor treatment for MRSA infections.    Ct Head Wo Contrast  Result Date: 11/01/2016 CLINICAL DATA:  Head trauma EXAM: CT HEAD WITHOUT CONTRAST TECHNIQUE: Contiguous axial images were obtained from the base of the skull through the vertex without intravenous contrast. COMPARISON:  Head CT 07/18/2015 FINDINGS: Brain: No mass lesion, intraparenchymal hemorrhage or extra-axial collection. No evidence of acute cortical infarct. There is periventricular hypoattenuation compatible with chronic microvascular disease. Unchanged age advanced atrophy with dilatation of the lateral ventricles. Vascular: No hyperdense vessel or unexpected calcification. Skull: Normal visualized skull base, calvarium and  extracranial soft tissues. Sinuses/Orbits: No sinus fluid levels or advanced mucosal thickening. No mastoid effusion. Normal orbits. IMPRESSION: Unchanged age advanced atrophy and findings of chronic ischemic microangiopathy. No acute abnormality. Electronically Signed   By: Ulyses Jarred M.D.   On: 11/01/2016 02:14   Dg Chest Portable 1 View  Result Date: 11/01/2016 CLINICAL DATA:  Preop for hip fracture. EXAM: PORTABLE CHEST  1 VIEW COMPARISON:  05/13/2016 CXR and chest CT 01/22/2007. FINDINGS: The heart size and mediastinal contours are within normal limits. Moderate aortic atherosclerosis at the arch. Calcified 14 x 17 mm nodule in the expected location of the left thyroid gland, confirmed on prior chest CT. Mild interstitial prominence is again noted which may reflect chronic bronchitic change. No pneumonia or CHF. The visualized skeletal structures are unremarkable. IMPRESSION: 1. No pneumonic consolidations. 2. Chronic bronchitic change. 3. Aortic atherosclerosis. 4. Calcified left 14 x 17 mm thyroid nodule. Electronically Signed   By: Ashley Royalty M.D.   On: 11/01/2016 02:17   Dg Hip Unilat W Or Wo Pelvis 2-3 Views Left  Result Date: 11/01/2016 CLINICAL DATA:  Fall with hip pain EXAM: DG HIP (WITH OR WITHOUT PELVIS) 2-3V LEFT COMPARISON:  None. FINDINGS: There is an acute fracture of the left femoral neck with medial angulation of the femoral shaft relative to the femoral head. There is mild overriding. femoroacetabular joint remains approximated. No pelvic diastasis. IMPRESSION: Left femoral neck fracture. Electronically Signed   By: Ulyses Jarred M.D.   On: 11/01/2016 02:10    Review of Systems  Unable to perform ROS: Dementia    Blood pressure (!) 177/67, pulse (!) 107, temperature 98.3 F (36.8 C), temperature source Oral, resp. rate 18, height 5' 6"  (1.676 m), weight 68.2 kg (150 lb 6.4 oz), SpO2 92 %. Physical Exam  Nursing note and vitals reviewed. Constitutional: She is oriented to  person, place, and time. She appears well-developed and well-nourished. No distress.  HENT:  Head: Normocephalic and atraumatic.  Mouth/Throat: Oropharynx is clear and moist.  Eyes: Pupils are equal, round, and reactive to light. Conjunctivae and EOM are normal. No scleral icterus.  Neck: Normal range of motion. Neck supple. No JVD present. No tracheal deviation present. No thyromegaly present.  Cardiovascular: Normal rate, regular rhythm and normal heart sounds.  Exam reveals no gallop and no friction rub.   No murmur heard. Respiratory: Effort normal and breath sounds normal.  GI: Soft. Bowel sounds are normal. She exhibits no distension. There is no tenderness.  Genitourinary:  Genitourinary Comments: Deferred  Musculoskeletal: She exhibits deformity. She exhibits no edema.  Left leg rotated and shortened   Lymphadenopathy:    She has no cervical adenopathy.  Neurological: She is alert and oriented to person, place, and time. No cranial nerve deficit. She exhibits normal muscle tone.  Skin: Skin is warm and dry. No rash noted. No erythema.  Psychiatric: She has a normal mood and affect. Her behavior is normal. Judgment and thought content normal.     Assessment/Plan This is a 71 year old female admitted for hip fracture. 1. Hip fracture: Left trochanteric. Orthopedic surgery consulted. The patient has poor rehab potential but is relatively low risk for surgery.  2. Hypertension: uncontrolled, likely secondary to pain. Continue HCTZ 3. Dementia: continue Namenda 4. Depression: continue Zoloft 5. ? Seizure disorder: continue Keppra 6. ? Autoimmune disease: hold methotrexate for now.  7. DVT prophylaxis: SCD's 8. GI prophylaxis: none The patient is a full code. Time spent on admission orders and patient care approximately 45 minutes.   Harrie Foreman, MD 11/01/2016, 7:45 AM

## 2016-11-01 NOTE — ED Notes (Signed)
Transport Pt to 1A room 149.AS

## 2016-11-01 NOTE — Anesthesia Procedure Notes (Signed)
Procedure Name: Intubation Date/Time: 11/01/2016 2:05 PM Performed by: Darlyne Russian Pre-anesthesia Checklist: Patient identified, Emergency Drugs available, Suction available, Patient being monitored and Timeout performed Patient Re-evaluated:Patient Re-evaluated prior to induction Oxygen Delivery Method: Circle system utilized Preoxygenation: Pre-oxygenation with 100% oxygen Induction Type: IV induction Ventilation: Mask ventilation without difficulty Laryngoscope Size: Mac and 3 Grade View: Grade II Tube type: Oral Tube size: 7.0 mm Number of attempts: 1 Airway Equipment and Method: Stylet Placement Confirmation: ETT inserted through vocal cords under direct vision,  positive ETCO2 and breath sounds checked- equal and bilateral Secured at: 21 cm Tube secured with: Tape Dental Injury: Teeth and Oropharynx as per pre-operative assessment

## 2016-11-01 NOTE — ED Provider Notes (Addendum)
[facility] Emergency Department Provider Note   ____________________________________________   First MD Initiated Contact with Patient 11/01/16 0104     (approximate)  I have reviewed the triage vital signs and the nursing notes.   HISTORY  Chief Complaint Fall  Patient with history of dementia who is unable to participate in history  HPI LUCIANN GOSSETT is a 71 y.o. female who comes from a nursing facility with a witnessed mechanical fall. According to EMS the patient stood up and then fell onto her left side. They report that she didn't hit her head but when they tried to get her up to stand she could not bear weight on the left. The patient also has a skin tear on her left elbow. when I asked the patient her birthday she could not tell me her date of birth. I also asked the patient what happened tonight and she spoke in some gibberish but could not answer my question. I asked her if her hip hurt and she said no but then she stated that she had pain everywhere. The patient could not give me a pain scale or answer any other questions.   Past Medical History:  Diagnosis Date  . Dementia   . Depression   . Hypertension   . Osteoarthritis     Patient Active Problem List   Diagnosis Date Noted  . Hip fracture (HCC) 11/01/2016  . Acute encephalopathy   . Hypothermia 07/18/2015  . Sepsis (HCC) 07/18/2015    History reviewed. No pertinent surgical history.  Prior to Admission medications   Medication Sig Start Date End Date Taking? Authorizing Provider  folic acid (FOLVITE) 1 MG tablet Take 1 mg by mouth daily.   Yes [provider]  hydrochlorothiazide (MICROZIDE) 12.5 MG capsule Take 12.5 mg by mouth daily.   Yes [provider]  HYDROcodone-acetaminophen (NORCO/VICODIN) 5-325 MG tablet Take 1 tablet by mouth 2 (two) times daily.   Yes [provider]  HYDROcodone-acetaminophen (NORCO/VICODIN) 5-325 MG tablet Take 1 tablet  by mouth every 4 (four) hours as needed for moderate pain.   Yes [provider]  levETIRAcetam (KEPPRA) 250 MG tablet Take 250 mg by mouth 2 (two) times daily.   Yes [provider]  loratadine (CLARITIN) 10 MG tablet Take 10 mg by mouth daily.   Yes [provider]  Melatonin 5 MG TABS Take 5 mg by mouth at bedtime.    Yes [provider]  memantine (NAMENDA) 5 MG tablet Take 5 mg by mouth 2 (two) times daily.   Yes [provider]  methotrexate (RHEUMATREX) 2.5 MG tablet Take 10 mg by mouth once a week. Caution:Chemotherapy. Protect from light.   Yes [provider]  Multiple Vitamin (DAILY VITE) TABS Take 1 tablet by mouth daily.   Yes [provider]  nystatin (NYSTATIN) powder Apply 1 g topically 2 (two) times daily as needed. Apply to areas that are inflammed and look yeast-like. Apply until rash resolved.   Yes [provider]  ondansetron (ZOFRAN) 4 MG tablet Take 1 tablet (4 mg total) by mouth daily as needed for nausea or vomiting. 06/13/15  Yes Emily Filbert, MD  Polyethyl Glycol-Propyl Glycol (SYSTANE ULTRA) 0.4-0.3 % SOLN Apply 1 drop to eye 3 (three) times daily as needed (dry eyes).    Yes [provider]  sertraline (ZOLOFT) 25 MG tablet Take 25 mg by mouth every morning.    Yes [provider]  Allergies Patient has no known allergies.  No family history on file.  Social History Social History  Substance Use Topics  . Smoking status: Former Smoker    Years: 45.00  . Smokeless tobacco: Never Used  . Alcohol use No    Review of Systems  unable to assess due to patient dementia.   ____________________________________________   PHYSICAL EXAM:  VITAL SIGNS: ED Triage Vitals  Enc Vitals Group     BP 11/01/16 0102 137/80     Pulse Rate 11/01/16 0100 94     Resp 11/01/16 0100 16     Temp 11/01/16 0102 98.2 F (36.8 C)     Temp Source 11/01/16 0102 Oral     SpO2  11/01/16 0100 97 %     Weight --      Height --      Head Circumference --      Peak Flow --      Pain Score --      Pain Loc --      Pain Edu? --      Excl. in GC? --     Constitutional: Alert and oriented to self, patient otherwise confused. Well appearing and in no acute distress. Eyes: Conjunctivae are normal. PERRL. EOMI. Head: Atraumatic. Nose: No congestion/rhinnorhea. Mouth/Throat: Mucous membranes are moist.  Oropharynx non-erythematous. Neck: No cervical spine tenderness to palpation Cardiovascular: Normal rate, regular rhythm. Grossly normal heart sounds.  Good peripheral circulation. Respiratory: Normal respiratory effort.  No retractions. Lungs CTAB. Gastrointestinal: Soft and nontender. No distention. Positive bowel sounds Musculoskeletal: No lower extremity tenderness nor edema.  . Neurologic:  Normal speech and language. No gross focal neurologic deficits are appreciated. No gait instability. Skin:  Skin is warm, dry skin tear to left elbow Psychiatric: Mood and affect are normal.   ____________________________________________   LABS (all labs ordered are listed, but only abnormal results are displayed)  Labs Reviewed  CBC - Abnormal; Notable for the following:       Result Value   RDW 14.9 (*)    All other components within normal limits  BASIC METABOLIC PANEL - Abnormal; Notable for the following:    Potassium 3.4 (*)    Glucose, Bld 112 (*)    Creatinine, Ser 1.02 (*)    GFR calc non Af Amer 55 (*)    All other components within normal limits  URINALYSIS, COMPLETE (UACMP) WITH MICROSCOPIC - Abnormal; Notable for the following:    Color, Urine AMBER (*)    APPearance CLOUDY (*)    Leukocytes, UA MODERATE (*)    Bacteria, UA MANY (*)    Squamous Epithelial / LPF 0-5 (*)    All other components within normal limits  MRSA PCR SCREENING  TROPONIN I  TSH  HEMOGLOBIN A1C   ____________________________________________  EKG  ED ECG REPORT I,  Rebecka Apley, the attending physician, personally viewed and interpreted this ECG.   Date: 11/01/2016  EKG Time: 106  Rate: 99  Rhythm: normal sinus rhythm  Axis: normal  Intervals:none  ST&T Change: none  ____________________________________________  RADIOLOGY  Ct Head Wo Contrast  Result Date: 11/01/2016 CLINICAL DATA:  Head trauma EXAM: CT HEAD WITHOUT CONTRAST TECHNIQUE: Contiguous axial images were obtained from the base of the skull through the vertex without intravenous contrast. COMPARISON:  Head CT 07/18/2015 FINDINGS: Brain: No mass lesion, intraparenchymal hemorrhage or extra-axial collection. No evidence of acute cortical infarct. There is periventricular hypoattenuation compatible with chronic microvascular disease. Unchanged age  advanced atrophy with dilatation of the lateral ventricles. Vascular: No hyperdense vessel or unexpected calcification. Skull: Normal visualized skull base, calvarium and extracranial soft tissues. Sinuses/Orbits: No sinus fluid levels or advanced mucosal thickening. No mastoid effusion. Normal orbits. IMPRESSION: Unchanged age advanced atrophy and findings of chronic ischemic microangiopathy. No acute abnormality. Electronically Signed   By: Deatra Robinson M.D.   On: 11/01/2016 02:14   Dg Chest Portable 1 View  Result Date: 11/01/2016 CLINICAL DATA:  Preop for hip fracture. EXAM: PORTABLE CHEST 1 VIEW COMPARISON:  05/13/2016 CXR and chest CT 01/22/2007. FINDINGS: The heart size and mediastinal contours are within normal limits. Moderate aortic atherosclerosis at the arch. Calcified 14 x 17 mm nodule in the expected location of the left thyroid gland, confirmed on prior chest CT. Mild interstitial prominence is again noted which may reflect chronic bronchitic change. No pneumonia or CHF. The visualized skeletal structures are unremarkable. IMPRESSION: 1. No pneumonic consolidations. 2. Chronic bronchitic change. 3. Aortic atherosclerosis. 4. Calcified  left 14 x 17 mm thyroid nodule. Electronically Signed   By: Tollie Eth M.D.   On: 11/01/2016 02:17   Dg Hip Unilat W Or Wo Pelvis 2-3 Views Left  Result Date: 11/01/2016 CLINICAL DATA:  Fall with hip pain EXAM: DG HIP (WITH OR WITHOUT PELVIS) 2-3V LEFT COMPARISON:  None. FINDINGS: There is an acute fracture of the left femoral neck with medial angulation of the femoral shaft relative to the femoral head. There is mild overriding. femoroacetabular joint remains approximated. No pelvic diastasis. IMPRESSION: Left femoral neck fracture. Electronically Signed   By: Deatra Robinson M.D.   On: 11/01/2016 02:10    ____________________________________________   PROCEDURES  Procedure(s) performed: None  Procedures  Critical Care performed: No  ____________________________________________   INITIAL IMPRESSION / ASSESSMENT AND PLAN / ED COURSE  Pertinent labs & imaging results that were available during my care of the patient were reviewed by me and considered in my medical decision making (see chart for details).  This is a 71 year old female who comes into the hospital today with a fall. the patient has a history of dementia but according to EMS and the note sent from the facility the patient could not bear weight on the left. My differential diagnosis includes hip fracture versus musculoskeletal strain. I will also evaluate for other causes of her fall aside from a mechanical fall. I sent the patient for an x-ray and it showed that she had a left femoral neck fracture. The patient's blood work is otherwise unremarkable. I did send the patient for a CT scan of her head as she could not tell me what happened and she does have a history of dementia. There is also no staff to confirm the story that she did not hit her head. I contacted Dr. Rosita Kea the orthopedic surgeon on-call and will admit the patient to the hospitalist service for hip fracture repair. I do not have the results of the Urinalysis at  this time.       ____________________________________________   FINAL CLINICAL IMPRESSION(S) / ED DIAGNOSES  Final diagnoses:  Fall, initial encounter  Closed fracture of left hip, initial encounter (HCC)  Skin tear of left elbow without complication, initial encounter      NEW MEDICATIONS STARTED DURING THIS VISIT:  Current Discharge Medication List       Note:  This document was prepared using Dragon voice recognition software and may include unintentional dictation errors.    Rebecka Apley, MD 11/01/16 [phone]  Rebecka Apley, MD 11/01/16 0330

## 2016-11-01 NOTE — OR Nursing (Signed)
Husband present in sds with patient.  She has dementia and is unable to sign or comprehend any consents, procedure, potential complications, etc.  Dr. Joice Lofts here to speak to husband regarding surgery.  He feels comfortable to move ahead and has had questions answered.  Urine via foley is cloudy.  Mouth is dry.  Patient smiles easily and does not complain or groan of pain.  SCD applied to right leg.

## 2016-11-01 NOTE — Consult Note (Signed)
Patient is a 71 year old white female who suffered a fall last night and is unable to give a history secondary to confusion.She has a high intertrochanteric basilar neck fracture On exam her leg is shortened and externally rotated X-rays show her fracture with some displacement. No significant osteoarthritis present. Impression is displaced high intertrochanter/basilar neck fracture fracture Recommendation is for ORIF with intramedullary device

## 2016-11-01 NOTE — Progress Notes (Signed)
Pt without iv pain medication and NPO for surgery. Spoke with Dr. Sheryle Hail md to place orders.

## 2016-11-01 NOTE — Progress Notes (Signed)
Received pt from emergency room alert to self only. Left hip fracture, surgery for later today.

## 2016-11-01 NOTE — ED Notes (Signed)
Family at bedside. 

## 2016-11-01 NOTE — Op Note (Signed)
11/01/2016  3:59 PM  Patient:   Kim Morgan  Pre-Op Diagnosis:   Closed displaced two-part intertrochanteric fracture left hip.  Post-Op Diagnosis:   Same  Procedure:   Reduction and internal fixation of displaced two-part intertrochanteric left hip fracture with Biomet Affixis TFN nail.  Surgeon:   Maryagnes Amos, MD  Assistant:   None  Anesthesia:   GET  Findings:   As above  Complications:   None  EBL:   50 cc  Fluids:   900 cc crystalloid  UOP:   150 cc  TT:   None  Drains:   None  Closure:   Staples  Implants:   Biomet Affixis 11 x 380 mm TFN with a 90 mm lag screw and a 40 mm distal interlocking screw  Brief Clinical Note:   The patient is a 71 year old female who sustained the above-noted injury last evening following a fall at her assisted living facility. She was brought into the emergency room where x-rays demonstrated the above-noted fracture. The patient has been cleared medically and presents at this time for reduction and internal fixation of the displaced intertrochanteric left hip fracture.  Procedure:   The patient was brought into the operating room. After adequate general endotracheal intubation and anesthesia was obtained, the patient was lain in the supine position on the fracture table. Initially, the uninjured leg was placed in a flexed and abducted position while the injured lower extremity was placed in longitudinal traction. However, the patient was too contracted to permit adequate rotation of the right hip to permit positioning of the C-arm. Therefore, the right leg was placed in straight leg traction and extended below the plane of the left hip in order to facilitate a lateral view. The fracture was reduced using longitudinal traction and internal rotation. The adequacy of reduction was verified fluoroscopically in AP and lateral projections and found to be near anatomic. The lateral aspects of the left hip and thigh were prepped with ChloraPrep  solution before being draped sterilely. Preoperative antibiotics were administered. A timeout was performed to verify the appropriate surgical site. The greater trochanter was identified fluoroscopically and an approximately 3 cm incision made about 2-3 fingerbreadths above the tip of the greater trochanter. The incision was carried down through the subcutaneous tissues to expose the gluteal fascia. This was split the length of the incision, providing access to the tip of the trochanter. Under fluoroscopic guidance, a guidewire was drilled through the tip of the trochanter into the proximal metaphysis to the level of the lesser trochanter. After verifying its position fluoroscopically in AP and lateral projections, it was overreamed with the initial reamer to the depth of the lesser trochanter. A guidewire was passed down through the femoral canal to the supracondylar region. The adequacy of guidewire position was verified fluoroscopically in AP and lateral projections before the length of the guidewire within the canal was measured and found to be 390 mm. Therefore, a 380 mm length nail was selected. The guidewire was overreamed sequentially using the flexible reamers, beginning with a 10.5 mm reamer and progressing to a 12.5 mm reamer. This provided good cortical chatter. The 11 x 380 mm Biomet Affixis TFN rod was selected and advanced to the appropriate depth, as verified fluoroscopically.   The guide system for the lag screw was positioned and advanced through an approximately 2 cm stab incision over the lateral aspect of the proximal femur. The guidewire was drilled up through the trochanteric femoral nail and into  the femoral neck to rest within 5 mm of subchondral bone. After verifying its position in the femoral neck and head in both AP and lateral projections, the guidewire was measured and found to be optimally replicated by a 90 mm lag screw. The guidewire was overreamed to the appropriate depth before  the lag screw was inserted and advanced to the appropriate depth as verified fluoroscopically in AP and lateral projections. The locking screw was advanced, then backed off a quarter turn to set the lag screw. The compression device was then positioned and compression across the fracture site obtained under fluoroscopic visualization. Good compression across the fracture site was achieved. Again the adequacy of hardware position and fracture reduction was verified fluoroscopically in AP and lateral projections and found to be excellent.  Attention was directed distally. Using the "perfect circle" technique, the leg and fluoroscopy machine were positioned appropriately. An approximate 1.5 cm stab incision was made over the skin at the appropriate point before the drill bit was advanced through the cortex and across the static hole of the nail. The appropriate length of the screw was determined before the 40 mm distal interlocking screw was positioned, then advanced and tightened securely. Again the adequacy of screw position was verified fluoroscopically in AP and lateral projections and found to be excellent.  The wounds were irrigated thoroughly with sterile saline solution before the abductor fascia was reapproximated using #1 Vicryl interrupted sutures. The subcutaneous tissues were closed using 2-0 Vicryl interrupted sutures. The skin was closed using staples. A total of 30 cc of 0.5% Sensorcaine with epinephrine was injected in and around all incisions. Sterile occlusive dressings were applied to all wounds before the patient was transferred back to her hospital bed. The patient was then awakened, extubated, and transferred to the recovery room in satisfactory condition after tolerating the procedure well.

## 2016-11-01 NOTE — H&P (Signed)
Paper H&P to be scanned into permanent record. H&P reviewed and patient re-examined. No changes. 

## 2016-11-01 NOTE — Anesthesia Postprocedure Evaluation (Signed)
Anesthesia Post Note  Patient: Kim Morgan  Procedure(s) Performed: Procedure(s) (LRB): INTRAMEDULLARY (IM) NAIL INTERTROCHANTRIC (Left)  Patient location during evaluation: PACU Anesthesia Type: General Level of consciousness: awake and alert and oriented Pain management: pain level controlled Vital Signs Assessment: post-procedure vital signs reviewed and stable Respiratory status: spontaneous breathing Cardiovascular status: blood pressure returned to baseline Anesthetic complications: no     Last Vitals:  Vitals:   11/01/16 1650 11/01/16 1700  BP: 139/64 134/60  Pulse: 94 89  Resp: 18 20  Temp:  37.1 C  SpO2: 99% 99%    Last Pain:  Vitals:   11/01/16 1700  TempSrc:   PainSc: Asleep                 Daeron Carreno

## 2016-11-01 NOTE — Anesthesia Preprocedure Evaluation (Signed)
Anesthesia Evaluation  Patient identified by MRN, date of birth, ID band Patient awake    Reviewed: Allergy & Precautions, NPO status , Patient's Chart, lab work & pertinent test results  History of Anesthesia Complications Negative for: history of anesthetic complications  Airway Mallampati: II       Dental   Pulmonary neg sleep apnea, neg COPD, former smoker,           Cardiovascular hypertension, Pt. on medications      Neuro/Psych neg Seizures Depression Dementia    GI/Hepatic Neg liver ROS, neg GERD  ,  Endo/Other  neg diabetes  Renal/GU negative Renal ROS     Musculoskeletal   Abdominal   Peds  Hematology   Anesthesia Other Findings   Reproductive/Obstetrics                             Anesthesia Physical Anesthesia Plan  ASA: IV  Anesthesia Plan: General   Post-op Pain Management:    Induction:   PONV Risk Score and Plan:   Airway Management Planned: LMA  Additional Equipment:   Intra-op Plan:   Post-operative Plan:   Informed Consent: I have reviewed the patients History and Physical, chart, labs and discussed the procedure including the risks, benefits and alternatives for the proposed anesthesia with the patient or authorized representative who has indicated his/her understanding and acceptance.     Plan Discussed with:   Anesthesia Plan Comments:         Anesthesia Quick Evaluation

## 2016-11-01 NOTE — Anesthesia Post-op Follow-up Note (Signed)
Anesthesia QCDR form completed.        

## 2016-11-01 NOTE — ED Triage Notes (Addendum)
Pt to ED via EMS from [facility] for witnessed mechanical fall tonight. Per EMS no LOC. Pt hx of dementia, possible pain to LFT knee and elbow.  Pt also has skin tear to LFT elbow. Pt VS stable.

## 2016-11-01 NOTE — Transfer of Care (Signed)
Immediate Anesthesia Transfer of Care Note  Patient: Kim Morgan  Procedure(s) Performed: Procedure(s): INTRAMEDULLARY (IM) NAIL INTERTROCHANTRIC (Left)  Patient Location: PACU  Anesthesia Type:General  Level of Consciousness: responds to stimulation  Airway & Oxygen Therapy: Patient Spontanous Breathing and Patient connected to face mask oxygen  Post-op Assessment: Report given to RN and Post -op Vital signs reviewed and stable  Post vital signs: Reviewed and stable  Last Vitals:  Vitals:   11/01/16 1250 11/01/16 1605  BP: (!) 154/81 (!) 166/80  Pulse: (!) 107 (!) 103  Resp: 16 13  Temp: 37.5 C 37.2 C  SpO2: 94% 100%    Last Pain:  Vitals:   11/01/16 1605  TempSrc: Temporal  PainSc: Asleep         Complications: No apparent anesthesia complications

## 2016-11-02 ENCOUNTER — Encounter: Payer: Self-pay | Admitting: Surgery

## 2016-11-02 LAB — CBC WITH DIFFERENTIAL/PLATELET
Basophils Absolute: 0 10*3/uL (ref 0–0.1)
Basophils Relative: 0 %
EOS PCT: 0 %
Eosinophils Absolute: 0 10*3/uL (ref 0–0.7)
HCT: 29.2 % — ABNORMAL LOW (ref 35.0–47.0)
Hemoglobin: 10 g/dL — ABNORMAL LOW (ref 12.0–16.0)
LYMPHS ABS: 1.4 10*3/uL (ref 1.0–3.6)
LYMPHS PCT: 14 %
MCH: 30.1 pg (ref 26.0–34.0)
MCHC: 34.2 g/dL (ref 32.0–36.0)
MCV: 88 fL (ref 80.0–100.0)
MONO ABS: 0.8 10*3/uL (ref 0.2–0.9)
Monocytes Relative: 8 %
Neutro Abs: 7.6 10*3/uL — ABNORMAL HIGH (ref 1.4–6.5)
Neutrophils Relative %: 78 %
PLATELETS: 175 10*3/uL (ref 150–440)
RBC: 3.32 MIL/uL — AB (ref 3.80–5.20)
RDW: 15.4 % — AB (ref 11.5–14.5)
WBC: 9.9 10*3/uL (ref 3.6–11.0)

## 2016-11-02 LAB — BASIC METABOLIC PANEL
Anion gap: 7 (ref 5–15)
BUN: 15 mg/dL (ref 6–20)
CALCIUM: 8.2 mg/dL — AB (ref 8.9–10.3)
CO2: 24 mmol/L (ref 22–32)
CREATININE: 0.81 mg/dL (ref 0.44–1.00)
Chloride: 109 mmol/L (ref 101–111)
GFR calc Af Amer: >60 mL/min (ref >60)
GLUCOSE: 109 mg/dL — AB (ref 65–99)
POTASSIUM: 3.5 mmol/L (ref 3.5–5.1)
SODIUM: 140 mmol/L (ref 135–145)

## 2016-11-02 LAB — URINE CULTURE

## 2016-11-02 MED ORDER — OXYCODONE HCL 5 MG PO TABS: 5.0000 mg | ORAL_TABLET | ORAL | 0 refills | Status: AC | PRN

## 2016-11-02 MED ORDER — ENOXAPARIN SODIUM 40 MG/0.4ML ~~LOC~~ SOLN: 40.0000 mg | SUBCUTANEOUS | 0 refills | Status: AC

## 2016-11-02 MED ORDER — CEFUROXIME AXETIL 250 MG PO TABS
250.0000 mg | ORAL_TABLET | Freq: Two times a day (BID) | ORAL | Status: AC
  Administered 2016-11-02 – 2016-11-04 (×4): 250 mg via ORAL
  Filled 2016-11-02 (×4): qty 1

## 2016-11-02 NOTE — Evaluation (Signed)
Physical Therapy Evaluation Patient Details Name: Kim Morgan MRN: [id number] DOB: Jul 28, 1945 Today's Date: 11/02/2016   History of Present Illness  Pt is a 71 yo F with past medical history of hypertension, osteoarthritis and dementia who presents to the emergency department after a fall. The fall was witnessed but we do not have details of the circumstances.  The patient is unable to ambulate and favors her left leg but does not appear to be in pain. Radiographic findings significant for left trochanteric fracture. Orthopedic surgery was notified and the emergency department contacted the hospitalist service for admission.  Pt found to have a L intertrochanteric hip fracture and is now s/p L hip ORIF with [employer] TFN nail.    Clinical Impression  Pt presents with deficits in strength, transfers, mobility, gait, balance, and activity tolerance.  Pt near dependent level with +2 Max A for bed mobility tasks and is limited by cognition with attempts at sequencing.  Pt required +2 Max A with transfers with +2 HHA.  Pt unable to come to full stand even with +2 Max assist but did clear EOB with max standing time around 10 sec.  Pt unable to advance either LE while in standing.  Per pt's spouse, staff members at pt's ALF have tried walkers with pt in the past but pt is unable to use them safely secondary to cognition.  Pt will benefit from PT services in a SNF setting upon discharge to safety address above deficits for decreased caregiver assistance and return to PLOF.        Follow Up Recommendations SNF    Equipment Recommendations  None recommended by PT    Recommendations for Other Services       Precautions / Restrictions Precautions Precautions: Fall Restrictions Weight Bearing Restrictions: Yes LLE Weight Bearing: Weight bearing as tolerated      Mobility  Bed Mobility Overal bed mobility: Needs Assistance Bed Mobility: Supine to Sit;Sit to Supine;Rolling Rolling: Max  assist;+2 for physical assistance   Supine to sit: +2 for physical assistance;Max assist Sit to supine: +2 for physical assistance;Max assist   General bed mobility comments: Pt near dependent level for bed mobility tasks and is limited by cognition with attempts at sequencing  Transfers Overall transfer level: Needs assistance Equipment used: 2 person hand held assist Transfers: Sit to/from Stand Sit to Stand: +2 physical assistance;Max assist         General transfer comment: Pt unable to come to full stand even with +2 Max assist but did clear EOB with max standing time around 10 sec  Ambulation/Gait             General Gait Details: Pt unable to advance either foot while in standing.  Stairs            Wheelchair Mobility    Modified Rankin (Stroke Patients Only)       Balance Overall balance assessment: Needs assistance Sitting-balance support: Feet unsupported;Feet supported;Bilateral upper extremity supported Sitting balance-Leahy Scale: Poor Sitting balance - Comments: Frequent mod A to prevent posterior LOB in sitting Postural control: Posterior lean Standing balance support: Bilateral upper extremity supported Standing balance-Leahy Scale: Poor Standing balance comment: Frequent max A to prevent posterior LOB in standing                             Pertinent Vitals/Pain Pain Location: Unable to assess fully secondary to cognition but pt does show minimal  signs of distress with bed mobility and standing    Home Living Family/patient expects to be discharged to:: Other (Comment) (Memory care at [facility]; history from spouse secondary to pt's cognitive deficits)               Home Equipment: None;Other (comment) Additional Comments: Staff at pt's ALF have tried walkers with pt in the past but pt is unable to use them safely secondary to cognition.    Prior Function Level of Independence: Needs assistance   Gait / Transfers  Assistance Needed: Pt ambulates ALF disstances with HHA, no other recent falls other than current fall  ADL's / Homemaking Assistance Needed: Pt is dependent level for ADLs        Hand Dominance        Extremity/Trunk Assessment   Upper Extremity Assessment Upper Extremity Assessment: Overall WFL for tasks assessed    Lower Extremity Assessment Lower Extremity Assessment: Generalized weakness;LLE deficits/detail LLE:  (Unable to assess secondary to cognition but limited functionally) LLE Sensation:  (Unable to assess secondary to cognition)       Communication   Communication: Other (comment) (limited by cognition)  Cognition Arousal/Alertness: Awake/alert Behavior During Therapy: WFL for tasks assessed/performed Overall Cognitive Status: History of cognitive impairments - at baseline                                        General Comments      Exercises Total Joint Exercises Ankle Circles/Pumps: PROM;AAROM;Both;10 reps;15 reps Heel Slides: PROM;AAROM;Both;10 reps Hip ABduction/ADduction: PROM;AAROM;Both;10 reps;15 reps Straight Leg Raises: PROM;AAROM;Both;10 reps;15 reps   Assessment/Plan    PT Assessment Patient needs continued PT services  PT Problem List Decreased strength;Decreased activity tolerance;Decreased balance;Decreased knowledge of use of DME;Decreased mobility       PT Treatment Interventions DME instruction;Gait training;Functional mobility training;Neuromuscular re-education;Balance training;Therapeutic exercise;Therapeutic activities;Patient/family education    PT Goals (Current goals can be found in the Care Plan section)  Acute Rehab PT Goals PT Goal Formulation: Patient unable to participate in goal setting Time For Goal Achievement: 11/15/16 Potential to Achieve Goals: Fair    Frequency BID   Barriers to discharge Decreased caregiver support      Co-evaluation               AM-PAC PT "6 Clicks" Daily  Activity  Outcome Measure Difficulty turning over in bed (including adjusting bedclothes, sheets and blankets)?: Unable Difficulty moving from lying on back to sitting on the side of the bed? : Unable Difficulty sitting down on and standing up from a chair with arms (e.g., wheelchair, bedside commode, etc,.)?: Unable Help needed moving to and from a bed to chair (including a wheelchair)?: Total Help needed walking in hospital room?: Total Help needed climbing 3-5 steps with a railing? : Total 6 Click Score: 6    End of Session Equipment Utilized During Treatment: Gait belt Activity Tolerance: Other (comment) (Unable to assess secondary to cognition) Patient left: in bed;with bed alarm set;with SCD's reapplied;Other (comment);with call bell/phone within reach (B heels floated) Nurse Communication: Mobility status PT Visit Diagnosis: Other abnormalities of gait and mobility (R26.89);Muscle weakness (generalized) (M62.81)    Time: 7416-3845 PT Time Calculation (min) (ACUTE ONLY): 29 min   Charges:   PT Evaluation $PT Eval Low Complexity: 1 Low PT Treatments $Therapeutic Exercise: 8-22 mins   PT G Codes:   PT G-Codes **NOT  FOR INPATIENT CLASS** Functional Assessment Tool Used: AM-PAC 6 Clicks Basic Mobility Functional Limitation: Mobility: Walking and moving around Mobility: Walking and Moving Around Current Status (Q1194): 100 percent impaired, limited or restricted Mobility: Walking and Moving Around Goal Status [phone]): At least 40 percent but less than 60 percent impaired, limited or restricted    D. Scott Kamrie Fanton PT, DPT 11/02/16, 11:35 AM

## 2016-11-02 NOTE — Progress Notes (Signed)
PASARR has been received, [phone] A. Clinical Social Worker (CSW) met with patient's husband Ronalee Belts at bedside and presented SNF bed offers. He chose [facility]. Plan is for patient to D/C to [facility] Friday 11/04/16 pending medical clearance. [facility] liaison is aware of above.    [employer], LCSW [phone]

## 2016-11-02 NOTE — Progress Notes (Signed)
  Subjective: 1 Day Post-Op Procedure(s) (LRB): INTRAMEDULLARY (IM) NAIL INTERTROCHANTRIC (Left) Patient reports pain as mild.   Patient seen in rounds with Dr. Joice Lofts. Patient is well, and has had no acute complaints or problems Plan is to go Skilled nursing facility after hospital stay. Negative for chest pain and shortness of breath Fever: no Gastrointestinal: Negative for nausea and vomiting  Objective: Vital signs in last 24 hours: Temp:  [97.9 F (36.6 C)-101.1 F (38.4 C)] 98.7 F (37.1 C) (09/26 0421) Pulse Rate:  [73-125] 93 (09/26 0421) Resp:  [13-20] 18 (09/26 0421) BP: (101-172)/(50-110) 130/69 (09/26 0421) SpO2:  [93 %-100 %] 100 % (09/26 0421)  Intake/Output from previous day:  Intake/Output Summary (Last 24 hours) at 11/02/16 0707 Last data filed at 11/02/16 3007  Gross per 24 hour  Intake          1363.33 ml  Output             1410 ml  Net           -46.67 ml    Intake/Output this shift: No intake/output data recorded.  Labs:  Recent Labs  11/01/16 0113 11/02/16 0418  HGB 12.9 10.0*    Recent Labs  11/01/16 0113 11/02/16 0418  WBC 10.7 9.9  RBC 4.27 3.32*  HCT 37.7 29.2*  PLT 250 175    Recent Labs  11/01/16 0113 11/02/16 0418  NA 142 140  K 3.4* 3.5  CL 107 109  CO2 25 24  BUN 19 15  CREATININE 1.02* 0.81  GLUCOSE 112* 109*  CALCIUM 9.3 8.2*   No results for input(s): LABPT, INR in the last 72 hours.   EXAM General - Patient is Alert and Confused Extremity - Intact pulses distally No cellulitis present Compartment soft Dressing/Incision - clean, dry, blood tinged drainage Motor Function - intact, not moving foot and toes well on exam.   Past Medical History:  Diagnosis Date  . Dementia   . Depression   . Hypertension   . Osteoarthritis     Assessment/Plan: 1 Day Post-Op Procedure(s) (LRB): INTRAMEDULLARY (IM) NAIL INTERTROCHANTRIC (Left) Active Problems:   Hip fracture (HCC)  Estimated body mass index is 24.28  kg/m as calculated from the following:   Height as of this encounter: 5\' 6"  (1.676 m).   Weight as of this encounter: 68.2 kg (150 lb 6.4 oz). Advance diet Up with therapy D/C IV fluids  Discharge planning. Bowel management.  DVT Prophylaxis - Lovenox, Foot Pumps and TED hose Weight-Bearing as tolerated to left leg  , PA-C Orthopaedic Surgery 11/02/2016, 7:07 AM

## 2016-11-02 NOTE — Discharge Instructions (Signed)
INSTRUCTIONS AFTER Surgery  o Remove items at home which could result in a fall. This includes throw rugs or furniture in walking pathways o ICE to the affected joint every three hours while awake for 30 minutes at a time, for at least the first 3-5 days, and then as needed for pain and swelling.  Continue to use ice for pain and swelling. You may notice swelling that will progress down to the foot and ankle.  This is normal after surgery.  Elevate your leg when you are not up walking on it.   o Continue to use the breathing machine you got in the hospital (incentive spirometer) which will help keep your temperature down.  It is common for your temperature to cycle up and down following surgery, especially at night when you are not up moving around and exerting yourself.  The breathing machine keeps your lungs expanded and your temperature down.   DIET:  As you were doing prior to hospitalization, we recommend a well-balanced diet.  DRESSING / WOUND CARE / SHOWERING  The surgical bandage is waterproof. The bandage can be changed if there is any saturation or drainage. Use good sterile technique. The staples will be removed in 2 weeks in the orthopedic office.  ACTIVITY  o Increase activity slowly as tolerated, but follow the weight bearing instructions below.   o No driving for 6 weeks or until further direction given by your physician.  You cannot drive while taking narcotics.  o No lifting or carrying greater than 10 lbs. until further directed by your surgeon. o Avoid periods of inactivity such as sitting longer than an hour when not asleep. This helps prevent blood clots.  o You may return to work once you are authorized by your doctor.     WEIGHT BEARING  Weight-bearing as tolerated   EXERCISES Gait training and ambulation with range of motion exercises. Strengthening.  CONSTIPATION  Constipation is defined medically as fewer than three stools per week and severe constipation as  less than one stool per week.  Even if you have a regular bowel pattern at home, your normal regimen is likely to be disrupted due to multiple reasons following surgery.  Combination of anesthesia, postoperative narcotics, change in appetite and fluid intake all can affect your bowels.   YOU MUST use at least one of the following options; they are listed in order of increasing strength to get the job done.  They are all available over the counter, and you may need to use some, POSSIBLY even all of these options:    Drink plenty of fluids (prune juice may be helpful) and high fiber foods Colace 100 mg by mouth twice a day  Senokot for constipation as directed and as needed Dulcolax (bisacodyl), take with full glass of water  Miralax (polyethylene glycol) once or twice a day as needed.  If you have tried all these things and are unable to have a bowel movement in the first 3-4 days after surgery call either your surgeon or your primary doctor.    If you experience loose stools or diarrhea, hold the medications until you stool forms back up.  If your symptoms do not get better within 1 week or if they get worse, check with your doctor.  If you experience "the worst abdominal pain ever" or develop nausea or vomiting, please contact the office immediately for further recommendations for treatment.   ITCHING:  If you experience itching with your medications, try taking only  a single pain pill, or even half a pain pill at a time.  You can also use Benadryl over the counter for itching or also to help with sleep.   TED HOSE STOCKINGS:  Use stockings on both legs until for at least 2 weeks or as directed by physician office. They may be removed at night for sleeping.  MEDICATIONS:  See your medication summary on the After Visit Summary that nursing will review with you.  You may have some home medications which will be placed on hold until you complete the course of blood thinner medication.  It is  important for you to complete the blood thinner medication as prescribed.  PRECAUTIONS:  If you experience chest pain or shortness of breath - call 911 immediately for transfer to the hospital emergency department.   If you develop a fever greater that 101 F, purulent drainage from wound, increased redness or drainage from wound, foul odor from the wound/dressing, or calf pain - CONTACT YOUR SURGEON.                                                   FOLLOW-UP APPOINTMENTS:  If you do not already have a post-op appointment, please call the office for an appointment to be seen by your surgeon.  Guidelines for how soon to be seen are listed in your After Visit Summary, but are typically between 1-4 weeks after surgery.  OTHER INSTRUCTIONS:     MAKE SURE YOU:   Understand these instructions.   Get help right away if you are not doing well or get worse.    Thank you for letting us be a part of your medical care team.  It is a privilege we respect greatly.  We hope these instructions will help you stay on track for a fast and full recovery!

## 2016-11-02 NOTE — Progress Notes (Signed)
[employer] - [place] at [facility]                                                                                                                                                                                  Patient Demographics   Kim Morgan, is a 71 y.o. female, DOB - 1945-11-02, MVH:[id number]  Admit date - 11/01/2016   Admitting Physician Arnaldo Natal, MD  Outpatient Primary MD for the patient is System, Pcp Not In   LOS - 1  Subjective: Patient with dementia postop day 1 currently asymptomatic    Review of Systems:   CONSTITUTIONAL: confused  Vitals:   Vitals:   11/02/16 0421 11/02/16 0844 11/02/16 1116 11/02/16 1118  BP: 130/69 (!) 126/58 127/61   Pulse: 93 83 (!) 101 89  Resp: 18  16   Temp: 98.7 F (37.1 C) 98.6 F (37 C) (!) 97.5 F (36.4 C)   TempSrc: Oral Oral Oral   SpO2: 100% 99% 94% 99%  Weight:      Height:        Wt Readings from Last 3 Encounters:  11/01/16 150 lb 6.4 oz (68.2 kg)  05/16/16 147 lb 8 oz (66.9 kg)  07/18/15 170 lb (77.1 kg)     Intake/Output Summary (Last 24 hours) at 11/02/16 1319 Last data filed at 11/02/16 0605  Gross per 24 hour  Intake          1363.33 ml  Output             1010 ml  Net           353.33 ml    Physical Exam:   GENERAL: Pleasant-appearing in no apparent distress.  HEAD, EYES, EARS, NOSE AND THROAT: Atraumatic, normocephalic. Extraocular muscles are intact. Pupils equal and reactive to light. Sclerae anicteric. No conjunctival injection. No oro-pharyngeal erythema.  NECK: Supple. There is no jugular venous distention. No bruits, no lymphadenopathy, no thyromegaly.  HEART: Regular rate and rhythm,. No murmurs, no rubs, no clicks.  LUNGS: Clear to auscultation bilaterally. No rales or rhonchi. No wheezes.  ABDOMEN: Soft, flat, nontender, nondistended. Has good bowel sounds. No hepatosplenomegaly appreciated.  EXTREMITIES: No evidence of any cyanosis, clubbing, or peripheral edema.   +2 pedal and radial pulses bilaterally.  NEUROLOGIC: patient confused SKIN: Moist and warm with no rashes appreciated.  Psych: confused at baseline LN: No inguinal LN enlargement    Antibiotics   Anti-infectives    Start     Dose/Rate Route Frequency Ordered Stop   11/01/16 1800  ceFAZolin (ANCEF) 2 g in dextrose 5 % 100 mL IVPB     2 g 240 mL/hr over  30 Minutes Intravenous Every 6 hours 11/01/16 1730 11/02/16 0630   11/01/16 1745  ceFAZolin (ANCEF) 2 g in dextrose 5 % 100 mL IVPB     2 g 240 mL/hr over 30 Minutes Intravenous  Once 11/01/16 1737 11/01/16 1815   11/01/16 1730  ceFAZolin (ANCEF) IVPB 2g/100 mL premix  Status:  Discontinued     2 g 200 mL/hr over 30 Minutes Intravenous Every 6 hours 11/01/16 1724 11/01/16 1730   11/01/16 1415  ceFAZolin (ANCEF) IVPB 1 g/50 mL premix  Status:  Discontinued     1 g 100 mL/hr over 30 Minutes Intravenous  Once 11/01/16 0911 11/01/16 1315   11/01/16 1415  ceFAZolin (ANCEF) IVPB 2g/100 mL premix  Status:  Discontinued     2 g 200 mL/hr over 30 Minutes Intravenous  Once 11/01/16 1315 11/01/16 1333   11/01/16 1415  ceFAZolin (ANCEF) 2 g in dextrose 5 % 100 mL injection     200 mL/hr over 30 Minutes Intravenous  Once 11/01/16 1333 11/01/16 1440   11/01/16 1322  ceFAZolin (ANCEF) 2-4 GM/100ML-% IVPB    Comments:  Dellis Filbert   : cabinet override      11/01/16 1322 11/02/16 0129   11/01/16 0900  cefTRIAXone (ROCEPHIN) 1 g in dextrose 5 % 50 mL IVPB  Status:  Discontinued     1 g 100 mL/hr over 30 Minutes Intravenous Daily 11/01/16 0826 11/01/16 1315      Medications   Scheduled Meds: . acetaminophen  1,000 mg Oral Q6H  . docusate sodium  100 mg Oral BID  . enoxaparin (LOVENOX) injection  40 mg Subcutaneous Q24H  . folic acid  1 mg Oral Daily  . hydrochlorothiazide  12.5 mg Oral Daily  . Influenza vac split quadrivalent PF  0.5 mL Intramuscular Tomorrow-1000  . levETIRAcetam  250 mg Oral BID  . loratadine  10 mg Oral Daily  .  Melatonin  5 mg Oral QHS  . memantine  5 mg Oral BID  . [START ON 11/07/2016] methotrexate  10 mg Oral Weekly  . multivitamin with minerals  1 tablet Oral Daily  . pneumococcal 23 valent vaccine  0.5 mL Intramuscular Tomorrow-1000  . potassium chloride  20 mEq Oral Once  . sertraline  25 mg Oral BH-[id number]   Continuous Infusions:  PRN Meds:.acetaminophen **OR** acetaminophen, bisacodyl, diphenhydrAMINE, hydrALAZINE, magnesium hydroxide, metoCLOPramide **OR** metoCLOPramide (REGLAN) injection, morphine injection, nystatin, ondansetron **OR** ondansetron (ZOFRAN) IV, oxyCODONE, polyvinyl alcohol, sodium phosphate   Data Review:   Micro Results Recent Results (from the past 240 hour(s))  Urine Culture     Status: Abnormal   Collection Time: 11/01/16  1:13 AM  Result Value Ref Range Status   Specimen Description URINE, RANDOM  Final   Special Requests NONE  Final   Culture MULTIPLE SPECIES PRESENT, SUGGEST RECOLLECTION (A)  Final   Report Status 11/02/2016 FINAL  Final  MRSA PCR Screening     Status: None   Collection Time: 11/01/16  3:23 AM  Result Value Ref Range Status   MRSA by PCR NEGATIVE NEGATIVE Final    Comment:        The GeneXpert MRSA Assay (FDA approved for NASAL specimens only), is one component of a comprehensive MRSA colonization surveillance program. It is not intended to diagnose MRSA infection nor to guide or monitor treatment for MRSA infections.     Radiology Reports Ct Head Wo Contrast  Result Date: 11/01/2016 CLINICAL DATA:  Head trauma EXAM: CT HEAD WITHOUT CONTRAST  TECHNIQUE: Contiguous axial images were obtained from the base of the skull through the vertex without intravenous contrast. COMPARISON:  Head CT 07/18/2015 FINDINGS: Brain: No mass lesion, intraparenchymal hemorrhage or extra-axial collection. No evidence of acute cortical infarct. There is periventricular hypoattenuation compatible with chronic microvascular disease. Unchanged age advanced  atrophy with dilatation of the lateral ventricles. Vascular: No hyperdense vessel or unexpected calcification. Skull: Normal visualized skull base, calvarium and extracranial soft tissues. Sinuses/Orbits: No sinus fluid levels or advanced mucosal thickening. No mastoid effusion. Normal orbits. IMPRESSION: Unchanged age advanced atrophy and findings of chronic ischemic microangiopathy. No acute abnormality. Electronically Signed   By: Deatra Robinson M.D.   On: 11/01/2016 02:14   Dg Chest Portable 1 View  Result Date: 11/01/2016 CLINICAL DATA:  Preop for hip fracture. EXAM: PORTABLE CHEST 1 VIEW COMPARISON:  05/13/2016 CXR and chest CT 01/22/2007. FINDINGS: The heart size and mediastinal contours are within normal limits. Moderate aortic atherosclerosis at the arch. Calcified 14 x 17 mm nodule in the expected location of the left thyroid gland, confirmed on prior chest CT. Mild interstitial prominence is again noted which may reflect chronic bronchitic change. No pneumonia or CHF. The visualized skeletal structures are unremarkable. IMPRESSION: 1. No pneumonic consolidations. 2. Chronic bronchitic change. 3. Aortic atherosclerosis. 4. Calcified left 14 x 17 mm thyroid nodule. Electronically Signed   By: Tollie Eth M.D.   On: 11/01/2016 02:17   Dg Hip Operative Unilat W Or W/o Pelvis Left  Result Date: 11/01/2016 CLINICAL DATA:  Status post ORIF for an intertrochanteric fracture. EXAM: OPERATIVE left HIP (WITH PELVIS IF PERFORMED) 4 VIEWS TECHNIQUE: Fluoroscopic spot image(s) were submitted for interpretation post-operatively. COMPARISON:  Left hip radiograph of today's date FINDINGS: The patient has undergone ORIF with placement of an intramedullary rod and telescoping screw for the intertrochanteric fracture. Alignment is now near anatomic. No postprocedure complication is observed. IMPRESSION: Successful ORIF of an intertrochanteric fracture of the left hip. No postprocedure complication is observed.  Electronically Signed   By: David  [place] M.D.   On: 11/01/2016 16:05   Dg Hip Unilat W Or Wo Pelvis 2-3 Views Left  Result Date: 11/01/2016 CLINICAL DATA:  Fall with hip pain EXAM: DG HIP (WITH OR WITHOUT PELVIS) 2-3V LEFT COMPARISON:  None. FINDINGS: There is an acute fracture of the left femoral neck with medial angulation of the femoral shaft relative to the femoral head. There is mild overriding. femoroacetabular joint remains approximated. No pelvic diastasis. IMPRESSION: Left femoral neck fracture. Electronically Signed   By: Deatra Robinson M.D.   On: 11/01/2016 02:10     CBC  Recent Labs Lab 11/01/16 0113 11/02/16 0418  WBC 10.7 9.9  HGB 12.9 10.0*  HCT 37.7 29.2*  PLT 250 175  MCV 88.2 88.0  MCH 30.3 30.1  MCHC 34.3 34.2  RDW 14.9* 15.4*  LYMPHSABS  --  1.4  MONOABS  --  0.8  EOSABS  --  0.0  BASOSABS  --  0.0    Chemistries   Recent Labs Lab 11/01/16 0113 11/02/16 0418  NA 142 140  K 3.4* 3.5  CL 107 109  CO2 25 24  GLUCOSE 112* 109*  BUN 19 15  CREATININE 1.02* 0.81  CALCIUM 9.3 8.2*   ------------------------------------------------------------------------------------------------------------------ estimated creatinine clearance is 61.4 mL/min (by C-G formula based on SCr of 0.81 mg/dL). ------------------------------------------------------------------------------------------------------------------  Recent Labs  11/01/16 0113  HGBA1C 5.2   ------------------------------------------------------------------------------------------------------------------ No results for input(s): CHOL, HDL, LDLCALC, TRIG, CHOLHDL, LDLDIRECT in the  last 72 hours. ------------------------------------------------------------------------------------------------------------------  Recent Labs  11/01/16 0113  TSH 3.487   ------------------------------------------------------------------------------------------------------------------ No results for input(s): VITAMINB12,  FOLATE, FERRITIN, TIBC, IRON, RETICCTPCT in the last 72 hours.  Coagulation profile No results for input(s): INR, PROTIME in the last 168 hours.  No results for input(s): DDIMER in the last 72 hours.  Cardiac Enzymes  Recent Labs Lab 11/01/16 0113  TROPONINI <0.03   ------------------------------------------------------------------------------------------------------------------ Invalid input(s): POCBNP    Assessment & Plan   This is a 71 year old female admitted for hip fracture. 1.cystitis changes anabiotic's of Ceftin  2. Hypertension: uncontrolled, likely secondary to pain. Continue HCTZ,when necessary IV hydralazine 3. Dementia: continue Namenda 4. Depression: continue Zoloft 5. ? Seizure disorder: continue Keppra 6. ? Autoimmune disease: hold methotrexate for now.  7. DVT prophylaxis: SCD's 8. GI prophylaxis: none        Code Status Orders        Start     Ordered   11/01/16 0313  Do not attempt resuscitation (DNR)  Continuous    Question Answer Comment  In the event of cardiac or respiratory ARREST Do not call a "code blue"   In the event of cardiac or respiratory ARREST Do not perform Intubation, CPR, defibrillation or ACLS   In the event of cardiac or respiratory ARREST Use medication by any route, position, wound care, and other measures to relive pain and suffering. May use oxygen, suction and manual treatment of airway obstruction as needed for comfort.      11/01/16 5809    Code Status History    Date Active Date Inactive Code Status Order ID Comments User Context   05/14/2016 11:17 AM 05/16/2016  5:30 PM DNR [id number]  Milagros Loll, MD Inpatient   05/13/2016  6:47 PM 05/14/2016 11:17 AM Full Code [id number]  Katha Hamming, MD ED   07/18/2015  7:10 AM 07/21/2015  2:40 PM Full Code [id number]  Ihor Austin, MD ED           Consults  neurology  DVT Prophylaxis Lovenox postop  Lab Results  Component Value Date   PLT 175 11/02/2016      Time Spent in minutes  32 minutes spent Greater than 50% of time spent in care coordination and counseling patient regarding the condition and plan of care.   Auburn Bilberry M.D on 11/02/2016 at 1:19 PM  Between 7am to 6pm - Pager - [phone]  After 6pm go to [email or web address] - password [email or web address] [facility]  [facility]   Office  [phone]

## 2016-11-02 NOTE — Care Management (Signed)
Patient is from [facility] assisted [facility]. RNCM will follow PT evaluation for HHPT and DME.

## 2016-11-02 NOTE — Progress Notes (Signed)
Physical Therapy Treatment Patient Details Name: Kim Morgan MRN: [id number] DOB: 05/17/1945 Today's Date: 11/02/2016    History of Present Illness Pt is a 71 yo F with past medical history of hypertension, osteoarthritis and dementia who presents to the emergency department after a fall. The fall was witnessed but we do not have details of the circumstances.  The patient is unable to ambulate and favors her left leg but does not appear to be in pain. Radiographic findings significant for left trochanteric fracture. Orthopedic surgery was notified and the emergency department contacted the hospitalist service for admission.  Pt found to have a L intertrochanteric hip fracture and is now s/p L hip ORIF with [employer] TFN nail.    PT Comments    Pt presents with deficits in strength, transfers, mobility, gait, balance, and activity tolerance.  Trunk rotation/rolling L/R in supine with max A for UE postioning, max A to roll, and max verbal cues for pt participation.  Pt required max encouragement and verbal/tactile cueing for participation in below therapeutic exercises.  Pt will benefit from PT services in a SNF setting to address above deficits for decreased caregiver assistance and return to PLOF.       Follow Up Recommendations  SNF     Equipment Recommendations  None recommended by PT    Recommendations for Other Services       Precautions / Restrictions Precautions Precautions: Fall Restrictions Weight Bearing Restrictions: Yes LLE Weight Bearing: Weight bearing as tolerated    Mobility  Bed Mobility Overal bed mobility: Needs Assistance Bed Mobility: Rolling Rolling: Max assist;+2 for physical assistance   Supine to sit: +2 for physical assistance;Max assist Sit to supine: +2 for physical assistance;Max assist   General bed mobility comments: Pt near dependent level for bed mobility tasks and is limited by cognition with attempts at sequencing  Transfers Overall  transfer level: Needs assistance Equipment used: 2 person hand held assist Transfers: Sit to/from Stand Sit to Stand: +2 physical assistance;Max assist         General transfer comment: Not tested this session  Ambulation/Gait             General Gait Details: Not tested this session   Stairs            Wheelchair Mobility    Modified Rankin (Stroke Patients Only)       Balance Overall balance assessment: Needs assistance Sitting-balance support: Feet unsupported;Feet supported;Bilateral upper extremity supported Sitting balance-Leahy Scale: Poor Sitting balance - Comments: Frequent mod A to prevent posterior LOB in sitting Postural control: Posterior lean Standing balance support: Bilateral upper extremity supported Standing balance-Leahy Scale: Poor Standing balance comment: Frequent max A to prevent posterior LOB in standing                            Cognition Arousal/Alertness: Awake/alert Behavior During Therapy: WFL for tasks assessed/performed Overall Cognitive Status: History of cognitive impairments - at baseline                                        Exercises Total Joint Exercises Ankle Circles/Pumps: PROM;AAROM;Both;Other (comment) (3 x 10 each) Short Arc Quad: PROM;AAROM;Both;Other (comment) (3 x 10 each) Heel Slides: PROM;AAROM;Both;Other (comment) (3 x 10 each) Hip ABduction/ADduction: PROM;AAROM;Both;Other (comment) (3 x 10 each) Straight Leg Raises: PROM;AAROM;Both;Other (comment) (3 x 10  each) Other Exercises Other Exercises: Trunk rotation/rolling L/R in supine with max A for UE postioning, max A to roll, and max verbal cues for pt participation    General Comments        Pertinent Vitals/Pain Pain Location: Unable to assess fully secondary to cognition but pt does show minimal signs of distress with bed mobility and standing    Home Living Family/patient expects to be discharged to:: Other (Comment)  (Memory care at [facility]; history from spouse secondary to pt's cognitive deficits)             Home Equipment: None;Other (comment) Additional Comments: Staff at pt's ALF have tried walkers with pt in the past but pt is unable to use them safely secondary to cognition.    Prior Function Level of Independence: Needs assistance  Gait / Transfers Assistance Needed: Pt ambulates ALF disstances with HHA, no other recent falls other than current fall ADL's / Homemaking Assistance Needed: Pt is dependent level for ADLs     PT Goals (current goals can now be found in the care plan section) Acute Rehab PT Goals PT Goal Formulation: Patient unable to participate in goal setting Time For Goal Achievement: 11/15/16 Potential to Achieve Goals: Fair    Frequency    BID      PT Plan Current plan remains appropriate    Co-evaluation              AM-PAC PT "6 Clicks" Daily Activity  Outcome Measure  Difficulty turning over in bed (including adjusting bedclothes, sheets and blankets)?: Unable Difficulty moving from lying on back to sitting on the side of the bed? : Unable Difficulty sitting down on and standing up from a chair with arms (e.g., wheelchair, bedside commode, etc,.)?: Unable Help needed moving to and from a bed to chair (including a wheelchair)?: Total Help needed walking in hospital room?: Total Help needed climbing 3-5 steps with a railing? : Total 6 Click Score: 6    End of Session Equipment Utilized During Treatment: Gait belt Activity Tolerance: Patient tolerated treatment well (Difficult to assess secondary to cognition) Patient left: in bed;with bed alarm set;with SCD's reapplied;Other (comment);with call bell/phone within reach (B heels floated) Nurse Communication: Mobility status PT Visit Diagnosis: Other abnormalities of gait and mobility (R26.89);Muscle weakness (generalized) (M62.81)     Time: 0569-7948 PT Time Calculation (min) (ACUTE ONLY):  24 min  Charges:  $Therapeutic Exercise: 8-22 mins $Therapeutic Activity: 8-22 mins                    G Codes:  Functional Assessment Tool Used: AM-PAC 6 Clicks Basic Mobility Functional Limitation: Mobility: Walking and moving around Mobility: Walking and Moving Around Current Status (A1655): 100 percent impaired, limited or restricted Mobility: Walking and Moving Around Goal Status (V7482): At least 40 percent but less than 60 percent impaired, limited or restricted    D. Scott Serrita Lueth PT, DPT 11/02/16, 12:30 PM

## 2016-11-03 LAB — CBC
HEMATOCRIT: 29.4 % — AB (ref 35.0–47.0)
HEMOGLOBIN: 10.3 g/dL — AB (ref 12.0–16.0)
MCH: 30.6 pg (ref 26.0–34.0)
MCHC: 35.1 g/dL (ref 32.0–36.0)
MCV: 87.1 fL (ref 80.0–100.0)
Platelets: 195 10*3/uL (ref 150–440)
RBC: 3.37 MIL/uL — AB (ref 3.80–5.20)
RDW: 15 % — ABNORMAL HIGH (ref 11.5–14.5)
WBC: 10.4 10*3/uL (ref 3.6–11.0)

## 2016-11-03 LAB — BASIC METABOLIC PANEL
ANION GAP: 7 (ref 5–15)
BUN: 14 mg/dL (ref 6–20)
CHLORIDE: 107 mmol/L (ref 101–111)
CO2: 25 mmol/L (ref 22–32)
Calcium: 8.2 mg/dL — ABNORMAL LOW (ref 8.9–10.3)
Creatinine, Ser: 0.74 mg/dL (ref 0.44–1.00)
GFR calc non Af Amer: >60 mL/min (ref >60)
Glucose, Bld: 102 mg/dL — ABNORMAL HIGH (ref 65–99)
Potassium: 3.4 mmol/L — ABNORMAL LOW (ref 3.5–5.1)
Sodium: 139 mmol/L (ref 135–145)

## 2016-11-03 MED ORDER — POTASSIUM CHLORIDE CRYS ER 20 MEQ PO TBCR
20.0000 meq | EXTENDED_RELEASE_TABLET | Freq: Once | ORAL | Status: AC
Start: 2016-11-03 — End: 2016-11-03
  Administered 2016-11-03: 20 meq via ORAL
  Filled 2016-11-03: qty 1

## 2016-11-03 NOTE — Progress Notes (Signed)
Physical Therapy Treatment Patient Details Name: Kim Morgan MRN: [id number] DOB: 1946-01-06 Today's Date: 11/03/2016    History of Present Illness Pt is a 71 yo F with past medical history of hypertension, osteoarthritis and dementia who presents to the emergency department after a fall. The fall was witnessed but we do not have details of the circumstances.  The patient is unable to ambulate and favors her left leg but does not appear to be in pain. Radiographic findings significant for left trochanteric fracture. Orthopedic surgery was notified and the emergency department contacted the hospitalist service for admission.  Pt found to have a L intertrochanteric hip fracture and is now s/p L hip ORIF with [employer] Affixis TFN nail.    PT Comments    During bed mobility pt grimaces/groans during when moving from sitting to supine. Increased assistance required for trunk and LE support when returning to supine. Unable to follow commands/cues for sequencing secondary to dementia. Pt actively resisting therapy this afternoon with transfer from recliner back to bed. She requires total assist +2. Provided cues for sequencing but unable to follow secondary to dementia. Pt with grimacing and groaning during transfer. Unable to ambulate at this time. Supine exercises performed but only PROM this afternoon as pt not following commands. Pt is very limited due to cognition. Frequency of PT during admission will be decreased from BID to QD. Pt will benefit from PT services to address deficits in strength, balance, and mobility in order to return to full function at home.    Follow Up Recommendations  SNF     Equipment Recommendations  None recommended by PT    Recommendations for Other Services       Precautions / Restrictions Precautions Precautions: Fall Restrictions Weight Bearing Restrictions: Yes LLE Weight Bearing: Weight bearing as tolerated    Mobility  Bed Mobility Overal bed mobility:  Needs Assistance Bed Mobility: Rolling;Sit to Supine Rolling: Max assist;+2 for physical assistance     Sit to supine: Max assist;+2 for physical assistance   General bed mobility comments: Pt grimaces/groans during when moving from sitting to supine. Increased assistance required for trunk and LE support when returning to supine. Unable to follow commands/cues for sequencing secondary to dementia  Transfers Overall transfer level: Needs assistance Equipment used: 2 person hand held assist Transfers: Sit to/from Stand Sit to Stand: +2 physical assistance;Total assist         General transfer comment: Pt actively resisting therapy this afternoon with transfer. She requires total assist +2. Provided cues for sequencing but unable to follow secondary to dementia. Pt with grimacing and groaning during transfer. Unable to ambulate at this time  Ambulation/Gait             General Gait Details: Unable/unsafe to ambulate at this time   Stairs            Wheelchair Mobility    Modified Rankin (Stroke Patients Only)       Balance Overall balance assessment: Needs assistance Sitting-balance support: Bilateral upper extremity supported;Feet supported Sitting balance-Leahy Scale: Fair     Standing balance support: Bilateral upper extremity supported Standing balance-Leahy Scale: Zero Standing balance comment: Total assist required for standing this afternoon                            Cognition Arousal/Alertness: Awake/alert Behavior During Therapy: Flat affect Overall Cognitive Status: History of cognitive impairments - at baseline  Exercises Total Joint Exercises Ankle Circles/Pumps: PROM;Both;Supine;20 reps Towel Squeeze: Both;Supine;PROM;20 reps Short Arc Quad: Supine;Left;PROM;20 reps Heel Slides: Left;Supine;PROM;20 reps Hip ABduction/ADduction: Left;Supine;PROM;20 reps Straight Leg Raises:  Left;Supine;PROM;20 reps Long Arc Quad: PROM;Left;10 reps;Seated Marching in Standing: PROM;Left;10 reps;Seated    General Comments        Pertinent Vitals/Pain Pain Assessment: Faces Faces Pain Scale: Hurts even more Pain Location: Grimacing/groaning with L hip movement Pain Intervention(s): Monitored during session    Home Living                      Prior Function            PT Goals (current goals can now be found in the care plan section) Acute Rehab PT Goals Patient Stated Goal: Unable to provide PT Goal Formulation: Patient unable to participate in goal setting Time For Goal Achievement: 11/15/16 Potential to Achieve Goals: Fair Progress towards PT goals: Not progressing toward goals - comment (Pt unable to follow commands secondary to dementia)    Frequency    7X/week      PT Plan Frequency needs to be updated    Co-evaluation              AM-PAC PT "6 Clicks" Daily Activity  Outcome Measure  Difficulty turning over in bed (including adjusting bedclothes, sheets and blankets)?: Unable Difficulty moving from lying on back to sitting on the side of the bed? : Unable Difficulty sitting down on and standing up from a chair with arms (e.g., wheelchair, bedside commode, etc,.)?: Unable Help needed moving to and from a bed to chair (including a wheelchair)?: Total Help needed walking in hospital room?: Total Help needed climbing 3-5 steps with a railing? : Total 6 Click Score: 6    End of Session Equipment Utilized During Treatment: Gait belt Activity Tolerance: Patient tolerated treatment well Patient left: with call bell/phone within reach;with SCD's reapplied;in bed;with bed alarm set;Other (comment) (towel roll under LLE) Nurse Communication: Mobility status PT Visit Diagnosis: Other abnormalities of gait and mobility (R26.89);Muscle weakness (generalized) (M62.81)     Time: 0093-8182 PT Time Calculation (min) (ACUTE ONLY): 23  min  Charges:  $Therapeutic Exercise: 8-22 mins $Therapeutic Activity: 8-22 mins                    G Codes:       Sharalyn Ink Huprich PT, DPT     Huprich,Jason 11/03/2016, 2:46 PM

## 2016-11-03 NOTE — Progress Notes (Signed)
[facility]                                                                                                                                                                                  Patient Demographics   Kim Morgan, is a 71 y.o. female, DOB - 06/22/1945, RUE:[id number]  Admit date - 11/01/2016   Admitting Physician Arnaldo Natal, MD  Outpatient Primary MD for the patient is System, Pcp Not In   LOS - 2  Subjective: Due to dementia and unable to provide any review of systems patient is currently stable    Review of Systems:   CONSTITUTIONAL: confused  Vitals:   Vitals:   11/02/16 1118 11/02/16 1635 11/02/16 1947 11/03/16 0745  BP:  (!) 114/46 (!) 126/53 (!) 145/67  Pulse: 89 88 95 100  Resp:  16 17 16   Temp:  99.1 F (37.3 C) 99.8 F (37.7 C) 98.9 F (37.2 C)  TempSrc:  Oral Oral Oral  SpO2: 99% 96% 93% 93%  Weight:      Height:        Wt Readings from Last 3 Encounters:  11/01/16 150 lb 6.4 oz (68.2 kg)  05/16/16 147 lb 8 oz (66.9 kg)  07/18/15 170 lb (77.1 kg)     Intake/Output Summary (Last 24 hours) at 11/03/16 1407 Last data filed at 11/03/16 0950  Gross per 24 hour  Intake                0 ml  Output              900 ml  Net             -900 ml    Physical Exam:   GENERAL: Pleasant-appearing in no apparent distress.  HEAD, EYES, EARS, NOSE AND THROAT: Atraumatic, normocephalic. Extraocular muscles are intact. Pupils equal and reactive to light. Sclerae anicteric. No conjunctival injection. No oro-pharyngeal erythema.  NECK: Supple. There is no jugular venous distention. No bruits, no lymphadenopathy, no thyromegaly.  HEART: Regular rate and rhythm,. No murmurs, no rubs, no clicks.  LUNGS: Clear to auscultation bilaterally. No rales or rhonchi. No wheezes.  ABDOMEN: Soft, flat, nontender, nondistended. Has good bowel sounds. No hepatosplenomegaly appreciated.  EXTREMITIES: No evidence of any cyanosis,  clubbing, or peripheral edema.  +2 pedal and radial pulses bilaterally.  NEUROLOGIC: patient confused SKIN: Moist and warm with no rashes appreciated.  Psych: confused at baseline LN: No inguinal LN enlargement    Antibiotics   Anti-infectives    Start     Dose/Rate Route Frequency Ordered Stop   11/02/16 1700  cefUROXime (CEFTIN) tablet 250  mg     250 mg Oral 2 times daily with meals 11/02/16 1332 11/04/16 1659   11/01/16 1800  ceFAZolin (ANCEF) 2 g in dextrose 5 % 100 mL IVPB     2 g 240 mL/hr over 30 Minutes Intravenous Every 6 hours 11/01/16 1730 11/02/16 0630   11/01/16 1745  ceFAZolin (ANCEF) 2 g in dextrose 5 % 100 mL IVPB     2 g 240 mL/hr over 30 Minutes Intravenous  Once 11/01/16 1737 11/01/16 1815   11/01/16 1730  ceFAZolin (ANCEF) IVPB 2g/100 mL premix  Status:  Discontinued     2 g 200 mL/hr over 30 Minutes Intravenous Every 6 hours 11/01/16 1724 11/01/16 1730   11/01/16 1415  ceFAZolin (ANCEF) IVPB 1 g/50 mL premix  Status:  Discontinued     1 g 100 mL/hr over 30 Minutes Intravenous  Once 11/01/16 0911 11/01/16 1315   11/01/16 1415  ceFAZolin (ANCEF) IVPB 2g/100 mL premix  Status:  Discontinued     2 g 200 mL/hr over 30 Minutes Intravenous  Once 11/01/16 1315 11/01/16 1333   11/01/16 1415  ceFAZolin (ANCEF) 2 g in dextrose 5 % 100 mL injection     200 mL/hr over 30 Minutes Intravenous  Once 11/01/16 1333 11/01/16 1440   11/01/16 1322  ceFAZolin (ANCEF) 2-4 GM/100ML-% IVPB    Comments:  Dellis Filbert   : cabinet override      11/01/16 1322 11/02/16 0129   11/01/16 0900  cefTRIAXone (ROCEPHIN) 1 g in dextrose 5 % 50 mL IVPB  Status:  Discontinued     1 g 100 mL/hr over 30 Minutes Intravenous Daily 11/01/16 0826 11/01/16 1315      Medications   Scheduled Meds: . cefUROXime  250 mg Oral BID WC  . docusate sodium  100 mg Oral BID  . enoxaparin (LOVENOX) injection  40 mg Subcutaneous Q24H  . folic acid  1 mg Oral Daily  . hydrochlorothiazide  12.5 mg Oral Daily   . Influenza vac split quadrivalent PF  0.5 mL Intramuscular Tomorrow-1000  . levETIRAcetam  250 mg Oral BID  . loratadine  10 mg Oral Daily  . Melatonin  5 mg Oral QHS  . memantine  5 mg Oral BID  . [START ON 11/07/2016] methotrexate  10 mg Oral Weekly  . multivitamin with minerals  1 tablet Oral Daily  . pneumococcal 23 valent vaccine  0.5 mL Intramuscular Tomorrow-1000  . potassium chloride  20 mEq Oral Once  . sertraline  25 mg Oral BH-q7a   Continuous Infusions:  PRN Meds:.acetaminophen **OR** acetaminophen, bisacodyl, diphenhydrAMINE, hydrALAZINE, magnesium hydroxide, metoCLOPramide **OR** metoCLOPramide (REGLAN) injection, morphine injection, nystatin, ondansetron **OR** ondansetron (ZOFRAN) IV, oxyCODONE, polyvinyl alcohol, sodium phosphate   Data Review:   Micro Results Recent Results (from the past 240 hour(s))  Urine Culture     Status: Abnormal   Collection Time: 11/01/16  1:13 AM  Result Value Ref Range Status   Specimen Description URINE, RANDOM  Final   Special Requests NONE  Final   Culture MULTIPLE SPECIES PRESENT, SUGGEST RECOLLECTION (A)  Final   Report Status 11/02/2016 FINAL  Final  MRSA PCR Screening     Status: None   Collection Time: 11/01/16  3:23 AM  Result Value Ref Range Status   MRSA by PCR NEGATIVE NEGATIVE Final    Comment:        The GeneXpert MRSA Assay (FDA approved for NASAL specimens only), is one component of a comprehensive MRSA colonization  surveillance program. It is not intended to diagnose MRSA infection nor to guide or monitor treatment for MRSA infections.     Radiology Reports Ct Head Wo Contrast  Result Date: 11/01/2016 CLINICAL DATA:  Head trauma EXAM: CT HEAD WITHOUT CONTRAST TECHNIQUE: Contiguous axial images were obtained from the base of the skull through the vertex without intravenous contrast. COMPARISON:  Head CT 07/18/2015 FINDINGS: Brain: No mass lesion, intraparenchymal hemorrhage or extra-axial collection. No  evidence of acute cortical infarct. There is periventricular hypoattenuation compatible with chronic microvascular disease. Unchanged age advanced atrophy with dilatation of the lateral ventricles. Vascular: No hyperdense vessel or unexpected calcification. Skull: Normal visualized skull base, calvarium and extracranial soft tissues. Sinuses/Orbits: No sinus fluid levels or advanced mucosal thickening. No mastoid effusion. Normal orbits. IMPRESSION: Unchanged age advanced atrophy and findings of chronic ischemic microangiopathy. No acute abnormality. Electronically Signed   By: Deatra Robinson M.D.   On: 11/01/2016 02:14   Dg Chest Portable 1 View  Result Date: 11/01/2016 CLINICAL DATA:  Preop for hip fracture. EXAM: PORTABLE CHEST 1 VIEW COMPARISON:  05/13/2016 CXR and chest CT 01/22/2007. FINDINGS: The heart size and mediastinal contours are within normal limits. Moderate aortic atherosclerosis at the arch. Calcified 14 x 17 mm nodule in the expected location of the left thyroid gland, confirmed on prior chest CT. Mild interstitial prominence is again noted which may reflect chronic bronchitic change. No pneumonia or CHF. The visualized skeletal structures are unremarkable. IMPRESSION: 1. No pneumonic consolidations. 2. Chronic bronchitic change. 3. Aortic atherosclerosis. 4. Calcified left 14 x 17 mm thyroid nodule. Electronically Signed   By: Tollie Eth M.D.   On: 11/01/2016 02:17   Dg Hip Operative Unilat W Or W/o Pelvis Left  Result Date: 11/01/2016 CLINICAL DATA:  Status post ORIF for an intertrochanteric fracture. EXAM: OPERATIVE left HIP (WITH PELVIS IF PERFORMED) 4 VIEWS TECHNIQUE: Fluoroscopic spot image(s) were submitted for interpretation post-operatively. COMPARISON:  Left hip radiograph of today's date FINDINGS: The patient has undergone ORIF with placement of an intramedullary rod and telescoping screw for the intertrochanteric fracture. Alignment is now near anatomic. No postprocedure  complication is observed. IMPRESSION: Successful ORIF of an intertrochanteric fracture of the left hip. No postprocedure complication is observed. Electronically Signed   By: David  [place] M.D.   On: 11/01/2016 16:05   Dg Hip Unilat W Or Wo Pelvis 2-3 Views Left  Result Date: 11/01/2016 CLINICAL DATA:  Fall with hip pain EXAM: DG HIP (WITH OR WITHOUT PELVIS) 2-3V LEFT COMPARISON:  None. FINDINGS: There is an acute fracture of the left femoral neck with medial angulation of the femoral shaft relative to the femoral head. There is mild overriding. femoroacetabular joint remains approximated. No pelvic diastasis. IMPRESSION: Left femoral neck fracture. Electronically Signed   By: Deatra Robinson M.D.   On: 11/01/2016 02:10     CBC  Recent Labs Lab 11/01/16 0113 11/02/16 0418 11/03/16 0537  WBC 10.7 9.9 10.4  HGB 12.9 10.0* 10.3*  HCT 37.7 29.2* 29.4*  PLT 250 175 195  MCV 88.2 88.0 87.1  MCH 30.3 30.1 30.6  MCHC 34.3 34.2 35.1  RDW 14.9* 15.4* 15.0*  LYMPHSABS  --  1.4  --   MONOABS  --  0.8  --   EOSABS  --  0.0  --   BASOSABS  --  0.0  --     Chemistries   Recent Labs Lab 11/01/16 0113 11/02/16 0418 11/03/16 0537  NA 142 140 139  K 3.4*  3.5 3.4*  CL 107 109 107  CO2 25 24 25   GLUCOSE 112* 109* 102*  BUN 19 15 14   CREATININE 1.02* 0.81 0.74  CALCIUM 9.3 8.2* 8.2*   ------------------------------------------------------------------------------------------------------------------ estimated creatinine clearance is 62.1 mL/min (by C-G formula based on SCr of 0.74 mg/dL). ------------------------------------------------------------------------------------------------------------------  Recent Labs  11/01/16 0113  HGBA1C 5.2   ------------------------------------------------------------------------------------------------------------------ No results for input(s): CHOL, HDL, LDLCALC, TRIG, CHOLHDL, LDLDIRECT in the last 72  hours. ------------------------------------------------------------------------------------------------------------------  Recent Labs  11/01/16 0113  TSH 3.487   ------------------------------------------------------------------------------------------------------------------ No results for input(s): VITAMINB12, FOLATE, FERRITIN, TIBC, IRON, RETICCTPCT in the last 72 hours.  Coagulation profile No results for input(s): INR, PROTIME in the last 168 hours.  No results for input(s): DDIMER in the last 72 hours.  Cardiac Enzymes  Recent Labs Lab 11/01/16 0113  TROPONINI <0.03   ------------------------------------------------------------------------------------------------------------------ Invalid input(s): POCBNP    Assessment & Plan   This is a 71 year old female admitted for hip fracture. 1.cystitis continue Ceftin 2. Hypertension:now mproved continue current regimen 3. Dementia: continue Namenda 4. Depression: continue Zoloft 5. ? Seizure disorder: continue Keppra 6. ? Autoimmune disease: hold methotrexate for now.  7. DVT prophylaxis: SCD's 8. GI prophylaxis: none   anticipate discharge tomorrow       Code Status Orders        Start     Ordered   11/01/16 0313  Do not attempt resuscitation (DNR)  Continuous    Question Answer Comment  In the event of cardiac or respiratory ARREST Do not call a "code blue"   In the event of cardiac or respiratory ARREST Do not perform Intubation, CPR, defibrillation or ACLS   In the event of cardiac or respiratory ARREST Use medication by any route, position, wound care, and other measures to relive pain and suffering. May use oxygen, suction and manual treatment of airway obstruction as needed for comfort.      11/01/16 2863    Code Status History    Date Active Date Inactive Code Status Order ID Comments User Context   05/14/2016 11:17 AM 05/16/2016  5:30 PM DNR [id number]  Milagros Loll, MD Inpatient   05/13/2016  6:47 PM  05/14/2016 11:17 AM Full Code [id number]  Katha Hamming, MD ED   07/18/2015  7:10 AM 07/21/2015  2:40 PM Full Code [id number]  Ihor Austin, MD ED           Consults  neurology  DVT Prophylaxis Lovenox postop  Lab Results  Component Value Date   PLT 195 11/03/2016     Time Spent in minutes 25 minutes spent Greater than 50% of time spent in care coordination and counseling patient regarding the condition and plan of care.   Auburn Bilberry M.D on 11/03/2016 at 2:07 PM  Between 7am to 6pm - Pager - [phone]  After 6pm go to [email or web address] - password [email or web address] [facility]  [facility]   Office  [phone]

## 2016-11-03 NOTE — Progress Notes (Signed)
  Subjective: 2 Days Post-Op Procedure(s) (LRB): INTRAMEDULLARY (IM) NAIL INTERTROCHANTRIC (Left) Patient reports pain as mild.   Patient seen in rounds with Dr. Joice Lofts. Patient is well, and has had no acute complaints or problems Plan is to go Skilled nursing facility after hospital stay. Negative for chest pain and shortness of breath Fever: no Gastrointestinal: Negative for nausea and vomiting  Objective: Vital signs in last 24 hours: Temp:  [97.5 F (36.4 C)-99.8 F (37.7 C)] 99.8 F (37.7 C) (09/[birth year] 1947) Pulse Rate:  [83-101] 95 (09/26 1947) Resp:  [16-17] 17 (09/26 1947) BP: (114-127)/(46-61) 126/53 (09/26 1947) SpO2:  [93 %-99 %] 93 % (09/26 1947)  Intake/Output from previous day:  Intake/Output Summary (Last 24 hours) at 11/03/16 0626 Last data filed at 11/03/16 0500  Gross per 24 hour  Intake              240 ml  Output              900 ml  Net             -660 ml    Intake/Output this shift: Total I/O In: -  Out: 400 [Urine:400]  Labs:  Recent Labs  11/01/16 0113 11/02/16 0418 11/03/16 0537  HGB 12.9 10.0* 10.3*    Recent Labs  11/02/16 0418 11/03/16 0537  WBC 9.9 10.4  RBC 3.32* 3.37*  HCT 29.2* 29.4*  PLT 175 195    Recent Labs  11/02/16 0418 11/03/16 0537  NA 140 139  K 3.5 3.4*  CL 109 107  CO2 24 25  BUN 15 14  CREATININE 0.81 0.74  GLUCOSE 109* 102*  CALCIUM 8.2* 8.2*   No results for input(s): LABPT, INR in the last 72 hours.   EXAM General - Patient is Alert and Confused Extremity - Intact pulses distally No cellulitis present Compartment soft Dressing/Incision - clean, dry, blood tinged drainage Motor Function - intact, not moving foot and toes well on exam. No ambulation  Past Medical History:  Diagnosis Date  . Dementia   . Depression   . Hypertension   . Osteoarthritis     Assessment/Plan: 2 Days Post-Op Procedure(s) (LRB): INTRAMEDULLARY (IM) NAIL INTERTROCHANTRIC (Left) Active Problems:   Hip fracture  (HCC)  Estimated body mass index is 24.28 kg/m as calculated from the following:   Height as of this encounter: 5\' 6"  (1.676 m).   Weight as of this encounter: 68.2 kg (150 lb 6.4 oz). Advance diet Up with therapy D/C IV fluids  Discharge planning. Discharge per medicine. The patient has had a bowel movement.  DVT Prophylaxis - Lovenox, Foot Pumps and TED hose Weight-Bearing as tolerated to left leg  , PA-C Orthopaedic Surgery 11/03/2016, 6:26 AM

## 2016-11-03 NOTE — Progress Notes (Signed)
Physical Therapy Treatment Patient Details Name: Kim Morgan MRN: [id number] DOB: [birth year] Today's Date: 11/03/2016    History of Present Illness Pt is a 71 yo F with past medical history of hypertension, osteoarthritis and dementia who presents to the emergency department after a fall. The fall was witnessed but we do not have details of the circumstances.  The patient is unable to ambulate and favors her left leg but does not appear to be in pain. Radiographic findings significant for left trochanteric fracture. Orthopedic surgery was notified and the emergency department contacted the hospitalist service for admission.  Pt found to have a L intertrochanteric hip fracture and is now s/p L hip ORIF with [employer] TFN nail.    PT Comments    Pt is significantly limited by dementia. She completes all supine exercises with therapist but they are mostly AAROM/PROM due to inability to follow commands. She requires maxA+2 for standing pivot transfer from bed to recliner. Once in standing she does provide some assistance and requires modA+1 to remain in standing. Able to shuffle her feet slightly during transfer. Unable/unsafe to attempt ambulation at this time. She will need SNF placement at discharge. Pt will benefit from PT services to address deficits in strength, balance, and mobility in order to return to full function at home.    Follow Up Recommendations  SNF     Equipment Recommendations  None recommended by PT    Recommendations for Other Services       Precautions / Restrictions Precautions Precautions: Fall Restrictions Weight Bearing Restrictions: Yes LLE Weight Bearing: Weight bearing as tolerated    Mobility  Bed Mobility Overal bed mobility: Needs Assistance Bed Mobility: Rolling;Supine to Sit Rolling: Max assist;+2 for physical assistance   Supine to sit: Max assist;+2 for physical assistance     General bed mobility comments: Pt grimaces/groans during  transfer from supine to sit. Requires maxA+2, almost dependent assist due to resistance from patient related to L hip pain with mobility. Unable to follow commands/cues for sequencing secondary to dementia  Transfers Overall transfer level: Needs assistance Equipment used: 2 person hand held assist Transfers: Sit to/from Stand Sit to Stand: Max assist;+2 physical assistance         General transfer comment: Pt requires maxA+2 for standing pivot transfer from bed to recliner. Once in standing she does provide some assistance and requires modA+1 to remain in standing. Able to shuffle her feet slightly during transfer. Unable/unsafe to attempt ambulation at this time  Ambulation/Gait                 Stairs            Wheelchair Mobility    Modified Rankin (Stroke Patients Only)       Balance Overall balance assessment: Needs assistance Sitting-balance support: Bilateral upper extremity supported;Feet supported Sitting balance-Leahy Scale: Fair Sitting balance - Comments: Improved sitting balance on this date. CGA only after brief period of supported sitting   Standing balance support: Bilateral upper extremity supported Standing balance-Leahy Scale: Poor Standing balance comment: ModA+1 to maintain balance once upright                            Cognition Arousal/Alertness: Awake/alert Behavior During Therapy: Flat affect Overall Cognitive Status: History of cognitive impairments - at baseline  Exercises Total Joint Exercises Ankle Circles/Pumps: PROM;Both;10 reps;Supine;Other (comment) (2 sets) Towel Squeeze: AAROM;Both;10 reps;Supine Short Arc Quad: AAROM;10 reps;Supine;Left Heel Slides: AAROM;Left;10 reps;Supine Hip ABduction/ADduction: AAROM;Left;10 reps;Supine Straight Leg Raises: AAROM;Left;10 reps;Supine    General Comments        Pertinent Vitals/Pain Pain Assessment:  Faces Faces Pain Scale: Hurts little more Pain Location: Grimacing/groaning with L hip movement Pain Intervention(s): Monitored during session    Home Living                      Prior Function            PT Goals (current goals can now be found in the care plan section) Acute Rehab PT Goals Patient Stated Goal: Unable to provide PT Goal Formulation: Patient unable to participate in goal setting Time For Goal Achievement: 11/15/16 Potential to Achieve Goals: Fair Progress towards PT goals: Progressing toward goals    Frequency    BID      PT Plan Current plan remains appropriate    Co-evaluation              AM-PAC PT "6 Clicks" Daily Activity  Outcome Measure  Difficulty turning over in bed (including adjusting bedclothes, sheets and blankets)?: Unable Difficulty moving from lying on back to sitting on the side of the bed? : Unable Difficulty sitting down on and standing up from a chair with arms (e.g., wheelchair, bedside commode, etc,.)?: Unable Help needed moving to and from a bed to chair (including a wheelchair)?: Total Help needed walking in hospital room?: Total Help needed climbing 3-5 steps with a railing? : Total 6 Click Score: 6    End of Session Equipment Utilized During Treatment: Gait belt Activity Tolerance: Patient tolerated treatment well Patient left: in chair;with call bell/phone within reach;with chair alarm set;with family/visitor present;with SCD's reapplied;Other (comment) (Pillow under LLE)   PT Visit Diagnosis: Other abnormalities of gait and mobility (R26.89);Muscle weakness (generalized) (M62.81)     Time: 5681-2751 PT Time Calculation (min) (ACUTE ONLY): 23 min  Charges:  $Therapeutic Exercise: 8-22 mins $Therapeutic Activity: 8-22 mins                    G Codes:       [name] PT, DPT     [name] 11/03/2016, 10:46 AM

## 2016-11-04 LAB — BASIC METABOLIC PANEL
ANION GAP: 6 (ref 5–15)
BUN: 18 mg/dL (ref 6–20)
CHLORIDE: 105 mmol/L (ref 101–111)
CO2: 27 mmol/L (ref 22–32)
Calcium: 8 mg/dL — ABNORMAL LOW (ref 8.9–10.3)
Creatinine, Ser: 0.74 mg/dL (ref 0.44–1.00)
GFR calc Af Amer: >60 mL/min (ref >60)
GLUCOSE: 107 mg/dL — AB (ref 65–99)
POTASSIUM: 3.2 mmol/L — AB (ref 3.5–5.1)
Sodium: 138 mmol/L (ref 135–145)

## 2016-11-04 MED ORDER — POTASSIUM CHLORIDE ER 10 MEQ PO TBCR: 10.0000 meq | EXTENDED_RELEASE_TABLET | Freq: Every day | ORAL | 0 refills | Status: AC

## 2016-11-04 MED ORDER — DOCUSATE SODIUM 100 MG PO CAPS: 100.0000 mg | ORAL_CAPSULE | Freq: Two times a day (BID) | ORAL | 0 refills | Status: AC

## 2016-11-04 MED ORDER — HYDROCODONE-ACETAMINOPHEN 5-325 MG PO TABS: 1.0000 | ORAL_TABLET | ORAL | 0 refills | Status: DC | PRN

## 2016-11-04 MED ORDER — POTASSIUM CHLORIDE CRYS ER 20 MEQ PO TBCR
40.0000 meq | EXTENDED_RELEASE_TABLET | Freq: Once | ORAL | Status: AC
  Administered 2016-11-04: 40 meq via ORAL
  Filled 2016-11-04: qty 2

## 2016-11-04 NOTE — Progress Notes (Signed)
Patient is medically stable for D/C to [facility] today. Per Jomarie Longs [facility] liaison patient can come today to room 702. RN will call report to RN Konrad Dolores at [phone] and arrange EMS for transport. Clinical Child psychotherapist (CSW) sent D/C orders to [facility] via [facility]. Patient is aware of above. CSW contacted patient's husband Kaylean Tupou and made him aware of above. Per Kathlene November the flowers in patient's room can stay at [facility]. Please reconsult if future social work needs arise. CSW signing off.   [employer], LCSW [phone]

## 2016-11-04 NOTE — Clinical Social Work Placement (Signed)
   CLINICAL SOCIAL WORK PLACEMENT  NOTE  Date:  11/04/2016  Patient Details  Name: Kim Morgan MRN: [id number] Date of Birth: 01-03-1946  Clinical Social Work is seeking post-discharge placement for this patient at the Skilled  Nursing Facility level of care (*CSW will initial, date and re-position this form in  chart as items are completed):  Yes   Patient/family provided with [facility] Clinical Social Work Department's list of facilities offering this level of care within the geographic area requested by the patient (or if unable, by the patient's family).  Yes   Patient/family informed of their freedom to choose among providers that offer the needed level of care, that participate in Medicare, Medicaid or managed care program needed by the patient, have an available bed and are willing to accept the patient.  Yes   Patient/family informed of [facility]'s ownership interest in [facility] and [facility], as well as of the fact that they are under no obligation to receive care at these facilities.  PASRR submitted to EDS on 11/01/16     PASRR number received on 11/02/16     Existing PASRR number confirmed on       FL2 transmitted to all facilities in geographic area requested by pt/family on 11/01/16     FL2 transmitted to all facilities within larger geographic area on       Patient informed that his/her managed care company has contracts with or will negotiate with certain facilities, including the following:        Yes   Patient/family informed of bed offers received.  Patient chooses bed at  ([facility] )     Physician recommends and patient chooses bed at      Patient to be transferred to  ([facility] ) on 11/04/16.  Patient to be transferred to facility by  [facility] )     Patient family notified on 11/04/16 of transfer.  Name of family member notified:   (Patient's husband Laporche Martelle is aware of D/C today. )     PHYSICIAN       Additional Comment:     _______________________________________________ Talib Headley, Darleen Crocker, LCSW 11/04/2016, 9:18 AM

## 2016-11-04 NOTE — Progress Notes (Signed)
  Subjective: 3 Days Post-Op Procedure(s) (LRB): INTRAMEDULLARY (IM) NAIL INTERTROCHANTRIC (Left) Patient reports pain as mild.   Patient seen in rounds with Dr. Joice Lofts. Patient is well, and has had no acute complaints or problems Plan is to go Skilled nursing facility after hospital stay. Negative for chest pain and shortness of breath Fever: no Gastrointestinal: Negative for nausea and vomiting  Objective: Vital signs in last 24 hours: Temp:  [98.9 F (37.2 C)-100.9 F (38.3 C)] 99.4 F (37.4 C) (09/27 2031) Pulse Rate:  [90-108] 90 (09/27 2031) Resp:  [16-18] 18 (09/27 2031) BP: (113-145)/(62-67) 142/62 (09/27 2031) SpO2:  [93 %-98 %] 95 % (09/27 2031)  Intake/Output from previous day:  Intake/Output Summary (Last 24 hours) at 11/04/16 0602 Last data filed at 11/04/16 0320  Gross per 24 hour  Intake              240 ml  Output             1000 ml  Net             -760 ml    Intake/Output this shift: Total I/O In: -  Out: 1000 [Urine:1000]  Labs:  Recent Labs  11/02/16 0418 11/03/16 0537  HGB 10.0* 10.3*    Recent Labs  11/02/16 0418 11/03/16 0537  WBC 9.9 10.4  RBC 3.32* 3.37*  HCT 29.2* 29.4*  PLT 175 195    Recent Labs  11/03/16 0537 11/04/16 0441  NA 139 138  K 3.4* 3.2*  CL 107 105  CO2 25 27  BUN 14 18  CREATININE 0.74 0.74  GLUCOSE 102* 107*  CALCIUM 8.2* 8.0*   No results for input(s): LABPT, INR in the last 72 hours.   EXAM General - Patient is Alert and Confused Extremity - Intact pulses distally No cellulitis present Compartment soft Dressing/Incision - clean, dry, blood tinged drainage Motor Function - intact, not moving foot and toes well on exam. No ambulation  Past Medical History:  Diagnosis Date  . Dementia   . Depression   . Hypertension   . Osteoarthritis     Assessment/Plan: 3 Days Post-Op Procedure(s) (LRB): INTRAMEDULLARY (IM) NAIL INTERTROCHANTRIC (Left) Active Problems:   Hip fracture  (HCC)  Estimated body mass index is 24.28 kg/m as calculated from the following:   Height as of this encounter: 5\' 6"  (1.676 m).   Weight as of this encounter: 68.2 kg (150 lb 6.4 oz). Advance diet Up with therapy D/C IV fluids  Discharge planning. Discharge per medicine. The patient has had a bowel movement.  DVT Prophylaxis - Lovenox, Foot Pumps and TED hose Weight-Bearing as tolerated to left leg  , PA-C Orthopaedic Surgery 11/04/2016, 6:02 AM

## 2016-11-04 NOTE — Discharge Summary (Signed)
[facility] -  at [facility], 71 y.o., DOB 06/10/1945, MRN [id number]. 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 Patient has advanced dementia and unable to provide any review of systems. She was seen in the emergency room and was noted to have left trochanteric fracture. Patient was seen by orthopedic surgery and was taken to the OR. With repair of her hip. She was noted to have cystitis which was treated with antibiotics. She is also noticed to have hypokalemia and will need potassium supplements for the next 3 days.  Potassium supplements for the next 3 days Lovenox for the next 2 weeks            Consults  orthopedic surgery  Significant Tests:  See full reports for all details     Ct Head Wo Contrast  Result Date: 11/01/2016 CLINICAL DATA:  Head trauma EXAM: CT HEAD WITHOUT CONTRAST TECHNIQUE: Contiguous axial images were obtained from the base of the skull through the vertex without intravenous contrast. COMPARISON:  Head CT 07/18/2015 FINDINGS: Brain: No mass lesion, intraparenchymal hemorrhage or extra-axial collection. No evidence of acute cortical infarct. There is periventricular hypoattenuation compatible with chronic microvascular disease. Unchanged age advanced atrophy with dilatation  of the lateral ventricles. Vascular: No hyperdense vessel or unexpected calcification. Skull: Normal visualized skull base, calvarium and extracranial soft tissues. Sinuses/Orbits: No sinus fluid levels or advanced mucosal thickening. No mastoid effusion. Normal orbits. IMPRESSION: Unchanged age advanced atrophy and findings of chronic ischemic microangiopathy. No acute abnormality. Electronically Signed   By: Deatra Robinson M.D.   On: 11/01/2016 02:14   Dg Chest Portable 1 View  Result Date: 11/01/2016 CLINICAL DATA:  Preop for hip fracture. EXAM: PORTABLE CHEST 1 VIEW COMPARISON:  05/13/2016 CXR and chest CT 01/22/2007. FINDINGS: The heart size and mediastinal contours are within normal limits. Moderate aortic atherosclerosis at the arch. Calcified 14 x 17 mm nodule in the expected location of the left thyroid gland, confirmed on prior chest CT. Mild interstitial prominence is again noted which may reflect chronic bronchitic change. No pneumonia or CHF. The visualized skeletal structures are unremarkable. IMPRESSION: 1. No pneumonic consolidations. 2. Chronic bronchitic change. 3. Aortic atherosclerosis. 4. Calcified left 14 x 17 mm thyroid nodule. Electronically Signed   By: Tollie Eth M.D.   On: 11/01/2016 02:17   Dg Hip Operative Unilat W Or W/o Pelvis Left  Result Date: 11/01/2016 CLINICAL DATA:  Status post ORIF for an intertrochanteric fracture. EXAM: OPERATIVE left HIP (WITH PELVIS IF PERFORMED) 4 VIEWS TECHNIQUE: Fluoroscopic spot image(s) were submitted for interpretation post-operatively. COMPARISON:  Left hip radiograph of today's date FINDINGS: The patient has undergone ORIF with placement of an intramedullary rod and telescoping screw for the intertrochanteric fracture. Alignment is now near anatomic. No postprocedure complication is observed. IMPRESSION: Successful ORIF of an intertrochanteric fracture of the left hip. No postprocedure complication is observed. Electronically Signed   By:  David  [place] M.D.   On:  11/01/2016 16:05   Dg Hip Unilat W Or Wo Pelvis 2-3 Views Left  Result Date: 11/01/2016 CLINICAL DATA:  Fall with hip pain EXAM: DG HIP (WITH OR WITHOUT PELVIS) 2-3V LEFT COMPARISON:  None. FINDINGS: There is an acute fracture of the left femoral neck with medial angulation of the femoral shaft relative to the femoral head. There is mild overriding. femoroacetabular joint remains approximated. No pelvic diastasis. IMPRESSION: Left femoral neck fracture. Electronically Signed   By: Deatra Robinson M.D.   On: 11/01/2016 02:10       Today   Subjective:   Kim Morgan  patient confused but comfortable no complaints  Objective:   Blood pressure (!) 142/62, pulse 90, temperature 99.4 F (37.4 C), temperature source Oral, resp. rate 18, height  (1.676 m), weight 150 lb 6.4 oz (68.2 kg), SpO2 95 %.  .  Intake/Output Summary (Last 24 hours) at 11/04/16 0848 Last data filed at 11/04/16 0641  Gross per 24 hour  Intake              240 ml  Output             1150 ml  Net             -910 ml    Exam VITAL SIGNS: Blood pressure (!) 142/62, pulse 90, temperature 99.4 F (37.4 C), temperature source Oral, resp. rate 18, height  (1.676 m), weight 150 lb 6.4 oz (68.2 kg), SpO2 95 %.  GENERAL:  71 y.o.-year-old patient lying in the bed with no acute distress.  EYES: Pupils equal, round, reactive to light and accommodation. No scleral icterus. Extraocular muscles intact.  HEENT: Head atraumatic, normocephalic. Oropharynx and nasopharynx clear.  NECK:  Supple, no jugular venous distention. No thyroid enlargement, no tenderness.  LUNGS: Normal breath sounds bilaterally, no wheezing, rales,rhonchi or crepitation. No use of accessory muscles of respiration.  CARDIOVASCULAR: S1, S2 normal. No murmurs, rubs, or gallops.  ABDOMEN: Soft, nontender, nondistended. Bowel sounds present. No organomegaly or mass.  EXTREMITIES: No pedal edema, cyanosis, or clubbing.   NEUROLOGIC: Cranial nerves II through XII are intact. Muscle strength 5/5 in all extremities. Sensation intact. Gait not checked.  PSYCHIATRIC: The patient is alert and oriented x 3.  SKIN: No obvious rash, lesion, or ulcer.   Data Review     CBC w Diff: Lab Results  Component Value Date   WBC 10.4 11/03/2016   HGB 10.3 (L) 11/03/2016   HCT 29.4 (L) 11/03/2016   PLT 195 11/03/2016   LYMPHOPCT 14 11/02/2016   MONOPCT 8 11/02/2016   EOSPCT 0 11/02/2016   BASOPCT 0 11/02/2016   CMP: Lab Results  Component Value Date   NA 138 11/04/2016   K 3.2 (L) 11/04/2016   CL 105 11/04/2016   CO2 27 11/04/2016   BUN 18 11/04/2016   CREATININE 0.74 11/04/2016   PROT 8.1 05/13/2016   ALBUMIN 3.7 05/13/2016   BILITOT 0.9 05/13/2016   ALKPHOS 63 05/13/2016   AST 24 05/13/2016   ALT 16 05/13/2016  .  Micro Results Recent Results (from the past 240 hour(s))  Urine Culture     Status: Abnormal   Collection Time: 11/01/16  1:13 AM  Result Value Ref Range Status   Specimen Description URINE, RANDOM  Final   Special Requests NONE  Final   Culture MULTIPLE SPECIES PRESENT, SUGGEST RECOLLECTION (A)  Final   Report Status 11/02/2016 FINAL  Final  MRSA PCR Screening  Status: None   Collection Time: 11/01/16  3:23 AM  Result Value Ref Range Status   MRSA by PCR NEGATIVE NEGATIVE Final    Comment:        The GeneXpert MRSA Assay (FDA approved for NASAL specimens only), is one component of a comprehensive MRSA colonization surveillance program. It is not intended to diagnose MRSA infection nor to guide or monitor treatment for MRSA infections.         Code Status Orders        Start     Ordered   11/01/16 0313  Do not attempt resuscitation (DNR)  Continuous    Question Answer Comment  In the event of cardiac or respiratory ARREST Do not call a "code blue"   In the event of cardiac or respiratory ARREST Do not perform Intubation, CPR, defibrillation or ACLS   In the event  of cardiac or respiratory ARREST Use medication by any route, position, wound care, and other measures to relive pain and suffering. May use oxygen, suction and manual treatment of airway obstruction as needed for comfort.      11/01/16 5035    Code Status History    Date Active Date Inactive Code Status Order ID Comments User Context   05/14/2016 11:17 AM 05/16/2016  5:30 PM DNR [id number]  Milagros Loll, MD Inpatient   05/13/2016  6:47 PM 05/14/2016 11:17 AM Full Code [id number]  Katha Hamming, MD ED   07/18/2015  7:10 AM 07/21/2015  2:40 PM Full Code [id number]  Ihor Austin, MD ED           Contact information for follow-up providers    Poggi, Excell Seltzer, MD Follow up in 2 week(s).   Specialty:  Surgery Why:  For staple removal Contact information: [address] [facility] [place] [address] [phone]            Contact information for after-discharge care    Destination    [facility] Follow up.   Specialty:  Skilled Nursing Facility Contact information: [address] [place] [address] [phone]                  Discharge Medications   Allergies as of 11/04/2016   No Known Allergies     Medication List    STOP taking these medications   HYDROcodone-acetaminophen 5-325 MG tablet Commonly known as:  NORCO/VICODIN     TAKE these medications   DAILY VITE Tabs Take 1 tablet by mouth daily.   docusate sodium 100 MG capsule Commonly known as:  COLACE Take 1 capsule (100 mg total) by mouth 2 (two) times daily.   enoxaparin 40 MG/0.4ML injection Commonly known as:  LOVENOX Inject 0.4 mLs (40 mg total) into the skin daily.   folic acid 1 MG tablet Commonly known as:  FOLVITE Take 1 mg by mouth daily.   hydrochlorothiazide 12.5 MG capsule Commonly known as:  MICROZIDE Take 12.5 mg by mouth daily.   levETIRAcetam 250 MG tablet Commonly known as:  KEPPRA Take 250 mg by mouth 2 (two) times  daily.   loratadine 10 MG tablet Commonly known as:  CLARITIN Take 10 mg by mouth daily.   Melatonin 5 MG Tabs Take 5 mg by mouth at bedtime.   memantine 5 MG tablet Commonly known as:  NAMENDA Take 5 mg by mouth 2 (two) times daily.   methotrexate 2.5 MG tablet Commonly known as:  RHEUMATREX Take 10 mg by  mouth once a week. Caution:Chemotherapy. Protect from light.   nystatin powder Generic drug:  nystatin Apply 1 g topically 2 (two) times daily as needed. Apply to areas that are inflammed and look yeast-like. Apply until rash resolved.   ondansetron 4 MG tablet Commonly known as:  ZOFRAN Take 1 tablet (4 mg total) by mouth daily as needed for nausea or vomiting.   oxyCODONE 5 MG immediate release tablet Commonly known as:  Oxy IR/ROXICODONE Take 1 tablet (5 mg total) by mouth every 4 (four) hours as needed for breakthrough pain.   potassium chloride 10 MEQ tablet Commonly known as:  K-DUR Take 1 tablet (10 mEq total) by mouth daily.   sertraline 25 MG tablet Commonly known as:  ZOLOFT Take 25 mg by mouth every morning.   SYSTANE ULTRA 0.4-0.3 % Soln Generic drug:  Polyethyl Glycol-Propyl Glycol Apply 1 drop to eye 3 (three) times daily as needed (dry eyes).            Discharge Care Instructions        Start     Ordered   11/04/16 0000  docusate sodium (COLACE) 100 MG capsule  2 times daily     11/04/16 0847   11/04/16 0000  potassium chloride (K-DUR) 10 MEQ tablet  Daily     11/04/16 0847   11/02/16 0000  enoxaparin (LOVENOX) 40 MG/0.4ML injection  Every 24 hours     11/02/16 0710   11/02/16 0000  oxyCODONE (OXY IR/ROXICODONE) 5 MG immediate release tablet  Every 4 hours PRN     11/02/16 0710         Total Time in preparing paper work, data evaluation and todays exam - 35 minutes  Auburn Bilberry M.D on 11/04/2016 at 8:48 AM  [facility]   Office  [phone]

## 2016-11-04 NOTE — Progress Notes (Signed)
Pt was discharged to EMS sent to [facility]. Called husband Kathlene November to inform him of discharge.

## 2017-01-07 DEATH — deceased

## 2017-04-12 IMAGING — CR DG CHEST 1V PORT
1 series · 1 of 1 positions shown · non-contrast
Comparison: Chest radiograph performed 11/28/2007

CLINICAL DATA: Status post witnessed seizure. Concern for chest
injury. Initial encounter.

EXAM:
PORTABLE CHEST 1 VIEW

[chest ap]
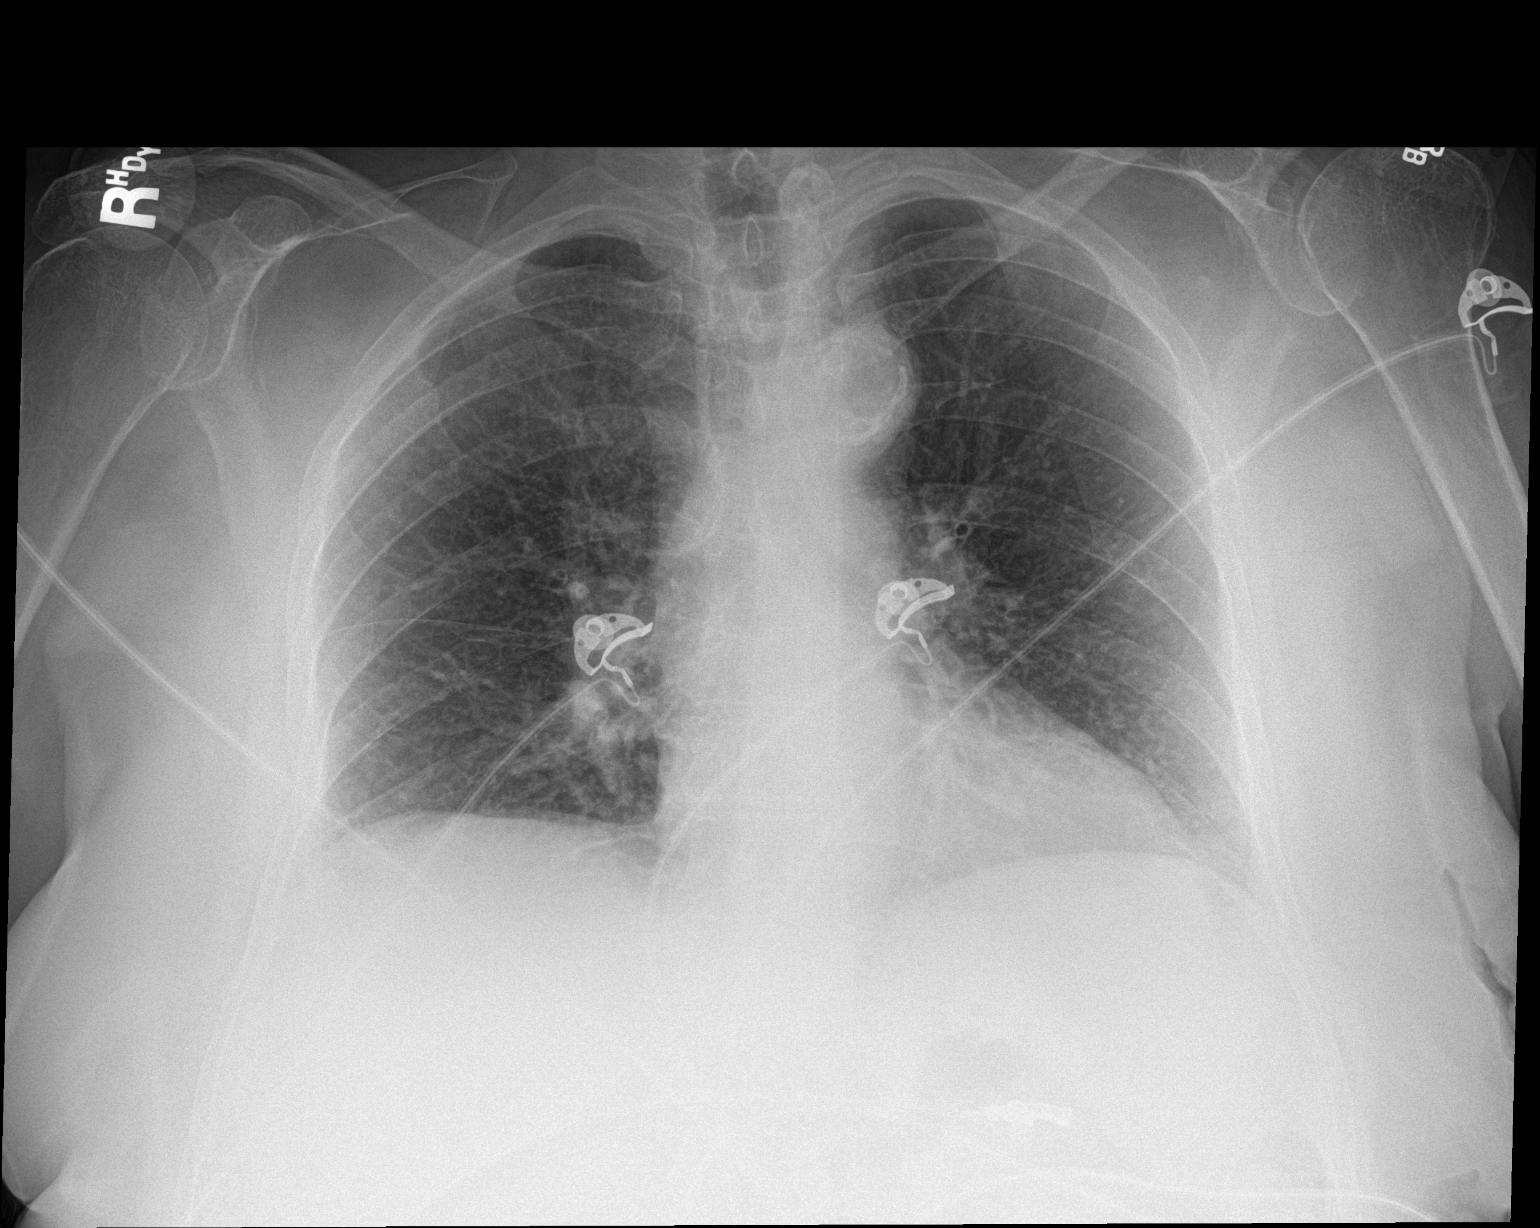

[1 of 1 positions shown; findings below may reference images not displayed]

FINDINGS: The lungs are hypoexpanded. Mild peribronchial thickening is noted.
Mild left basilar atelectasis is seen. No pleural effusion or
pneumothorax is identified.

The cardiomediastinal silhouette is normal in size. No acute osseous
abnormalities are identified.
IMPRESSION: Lungs hypoexpanded, with mild peribronchial thickening and mild left
basilar atelectasis.
